# Patient Record
Sex: Female | Born: 1980 | Race: White | Hispanic: No | Smoking: Former smoker
Health system: Southern US, Community
[De-identification: ages and names within clinical notes are randomized; demographics above are authoritative.]

## PROBLEM LIST (undated history)

## (undated) DIAGNOSIS — F329 Major depressive disorder, single episode, unspecified: Secondary | ICD-10-CM

## (undated) DIAGNOSIS — R519 Headache, unspecified: Secondary | ICD-10-CM

## (undated) DIAGNOSIS — I493 Ventricular premature depolarization: Secondary | ICD-10-CM

## (undated) DIAGNOSIS — F32A Depression, unspecified: Secondary | ICD-10-CM

## (undated) DIAGNOSIS — Z862 Personal history of diseases of the blood and blood-forming organs and certain disorders involving the immune mechanism: Secondary | ICD-10-CM

## (undated) DIAGNOSIS — I10 Essential (primary) hypertension: Secondary | ICD-10-CM

## (undated) HISTORY — DX: Essential (primary) hypertension: I10

## (undated) HISTORY — DX: Ventricular premature depolarization: I49.3

## (undated) HISTORY — PX: WISDOM TOOTH EXTRACTION: SHX21

## (undated) HISTORY — DX: Depression, unspecified: F32.A

---
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
## 2002-12-15 ENCOUNTER — Other Ambulatory Visit: Admission: RE | Admit: 2002-12-15 | Discharge: 2002-12-15 | Payer: Self-pay | Admitting: Obstetrics & Gynecology

## 2003-12-21 ENCOUNTER — Other Ambulatory Visit: Admission: RE | Admit: 2003-12-21 | Discharge: 2003-12-21 | Payer: Self-pay | Admitting: Obstetrics & Gynecology

## 2006-10-12 DIAGNOSIS — D6862 Lupus anticoagulant syndrome: Secondary | ICD-10-CM

## 2006-10-12 HISTORY — DX: Other diseases of the blood and blood-forming organs and certain disorders involving the immune mechanism complicating pregnancy, third trimester: D68.62

## 2010-10-12 HISTORY — PX: TUBAL LIGATION: SHX77

## 2015-10-13 DIAGNOSIS — F319 Bipolar disorder, unspecified: Secondary | ICD-10-CM

## 2015-10-13 HISTORY — DX: Bipolar disorder, unspecified: F31.9

## 2016-02-05 DIAGNOSIS — F319 Bipolar disorder, unspecified: Secondary | ICD-10-CM | POA: Insufficient documentation

## 2016-02-05 DIAGNOSIS — I10 Essential (primary) hypertension: Secondary | ICD-10-CM | POA: Insufficient documentation

## 2016-02-05 HISTORY — DX: Essential (primary) hypertension: I10

## 2016-02-05 HISTORY — DX: Bipolar disorder, unspecified: F31.9

## 2019-05-24 NOTE — Progress Notes (Signed)
Cardiology Office Note:    Date:  05/25/2019   ID:  Carmen Ramirez, DOB 02/08/1981, MRN [id number]  PCP:  Shellia CleverlyBeane, Lori M, PA  Cardiologist:  Norman HerrlichBrian Royce Stegman, MD   Referring MD: Shellia CleverlyBeane, Lori M, [place]  ASSESSMENT:    1. Essential hypertension   2. Nonspecific abnormal electrocardiogram (ECG) (EKG)    PLAN:    In order of problems listed above:  1. Stable continue current antihypertensive 2. T wave changes I suspect this is a manifestation of of hypertension LV strain we will recheck echocardiogram at this time after a nice discussion we do not feel she needs other evaluation such as cardiac MR or an ischemia evaluation  Next appointment as needed   Medication Adjustments/Labs and Tests Ordered: Current medicines are reviewed at length with the patient today.  Concerns regarding medicines are outlined above.  No orders of the defined types were placed in this encounter.  No orders of the defined types were placed in this encounter.    Chief Complaint  Patient presents with   Abnormal ECG    History of Present Illness:    Carmen Ramirez is a 38 y.o. female with hypertensionwho is being seen today for the evaluation of an abnormal EKG 05/20/2019 at the request of Shellia CleverlyBeane, Lori M, PA. [facility]. She has a PMH of HTN, PVC's, depression.She has a background history of palpitations in 2002 how she had an echocardiogram was normal and reviewed the report she was noted to have PVCs and 1 4 beat run of PVCs but a 24-hour Holter monitor was normal with a single ventricular premature contraction and 7 single APCs.  She had a history of hypertension in 3 years ago initiated therapy with number sustained in the range of 160/100 she has been well controlled and EKG was done routinely showing the T wave inversion on previous EKG in 2002 that I reviewed was normal.  She has had a little or no palpitation no short of breath or edema.  She had one episode of nonexertional brief  chest pain about a month ago no recurrence was not severe or sustained.  She has no known history of congenital or rheumatic heart disease.  There is no family history of sudden death or cardia myopathy.  She relates recent labs were performed and thyroid CBC and CMP were normal.  Past Medical History:  Diagnosis Date   Depression    HTN (hypertension)    PVC (premature ventricular contraction)     Past Surgical History:  Procedure Laterality Date   CESAREAN SECTION     TUBAL LIGATION      Current Medications: Current Meds  Medication Sig   buPROPion (WELLBUTRIN XL) 300 MG 24 hr tablet Take 300 mg by mouth daily.   escitalopram (LEXAPRO) 20 MG tablet Take 20 mg by mouth daily.   nebivolol (BYSTOLIC) 10 MG tablet Take 10 mg by mouth daily.   Oxcarbazepine (TRILEPTAL) 300 MG tablet Take 300 mg by mouth daily.     Allergies:   Penicillins and Sulfa antibiotics   Social History   Socioeconomic History   Marital status: Unknown    Spouse name: Not on file   Number of children: Not on file   Years of education: Not on file   Highest education level: Not on file  Occupational History   Not on file  Social Needs   Financial resource strain: Not on file   Food insecurity    Worry: Not  on file    Inability: Not on file   Transportation needs    Medical: Not on file    Non-medical: Not on file  Tobacco Use   Smoking status: Former Smoker    Packs/day: 0.50    Years: 10.00    Pack years: 5.00    Quit date: 2015    Years since quitting: 5.6   Smokeless tobacco: Never Used  Substance and Sexual Activity   Alcohol use: Yes    Comment: social   Drug use: Never   Sexual activity: Not on file  Lifestyle   Physical activity    Days per week: Not on file    Minutes per session: Not on file   Stress: Not on file  Relationships   Social connections    Talks on phone: Not on file    Gets together: Not on file    Attends religious service: Not on  file    Active member of club or organization: Not on file    Attends meetings of clubs or organizations: Not on file    Relationship status: Not on file  Other Topics Concern   Not on file  Social History Narrative   Works as PA in [employer]     Family History: The patient's family history includes Cancer in her paternal grandmother; Hypertension in her maternal grandfather, maternal grandmother, and paternal grandmother; Stroke in her paternal grandfather.  ROS:   ROS Please see the history of present illness.     All other systems reviewed and are negative.  EKGs/Labs/Other Studies Reviewed:    The following studies were reviewed today:   EKG:  EKG is  ordered today.  The ekg ordered today is personally reviewed and demonstrates similar pattern of T wave inversions inferiorly precordial lead most consistent with repolarization secondary to hypertension  Recent Labs: No results found for requested labs within last 8760 hours.  Recent Lipid Panel No results found for: CHOL, TRIG, HDL, CHOLHDL, VLDL, LDLCALC, LDLDIRECT  Physical Exam:    VS:  BP 104/68 (BP Location: Right Arm, Patient Position: Sitting, Cuff Size: Normal)    Pulse 78    Ht 5\' 7"  (1.702 m)    Wt 158 lb 1.9 oz (71.7 kg)    SpO2 98%    BMI 24.77 kg/m     Wt Readings from Last 3 Encounters:  05/25/19 158 lb 1.9 oz (71.7 kg)     GEN:  Well nourished, well developed in no acute distress HEENT: Normal NECK: No JVD; No carotid bruits LYMPHATICS: No lymphadenopathy CARDIAC: RRR, no murmurs, rubs, gallops RESPIRATORY:  Clear to auscultation without rales, wheezing or rhonchi  ABDOMEN: Soft, non-tender, non-distended MUSCULOSKELETAL:  No edema; No deformity  SKIN: Warm and dry NEUROLOGIC:  Alert and oriented x 3 PSYCHIATRIC:  Normal affect     Signed, Shirlee More, MD  05/25/2019 2:28 PM    [facility]

## 2019-05-25 ENCOUNTER — Ambulatory Visit (INDEPENDENT_AMBULATORY_CARE_PROVIDER_SITE_OTHER): Payer: BC Managed Care – PPO | Admitting: Cardiology

## 2019-05-25 ENCOUNTER — Other Ambulatory Visit: Payer: Self-pay

## 2019-05-25 ENCOUNTER — Encounter: Payer: Self-pay | Admitting: Cardiology

## 2019-05-25 VITALS — BP 104/68 | HR 78 | Ht 67.0 in | Wt 158.1 lb

## 2019-05-25 DIAGNOSIS — I10 Essential (primary) hypertension: Secondary | ICD-10-CM | POA: Diagnosis not present

## 2019-05-25 DIAGNOSIS — R9431 Abnormal electrocardiogram [ECG] [EKG]: Secondary | ICD-10-CM

## 2019-05-25 NOTE — Patient Instructions (Signed)
Medication Instructions:  Your physician recommends that you continue on your current medications as directed. Please refer to the Current Medication list given to you today.  If you need a refill on your cardiac medications before your next appointment, please call your pharmacy.   Lab work: None  If you have labs (blood work) drawn today and your tests are completely normal, you will receive your results only by: Marland Kitchen MyChart Message (if you have MyChart) OR . A paper copy in the mail If you have any lab test that is abnormal or we need to change your treatment, we will call you to review the results.  Testing/Procedures: You had an EKG today.   Your physician has requested that you have an echocardiogram. Echocardiography is a painless test that uses sound waves to create images of your heart. It provides your doctor with information about the size and shape of your heart and how well your heart's chambers and valves are working. This procedure takes approximately one hour. There are no restrictions for this procedure.  Follow-Up: At [facility], you and your health needs are our priority.  As part of our continuing mission to provide you with exceptional heart care, we have created designated Provider Care Teams.  These Care Teams include your primary Cardiologist (physician) and Advanced Practice Providers (APPs -  Physician Assistants and Nurse Practitioners) who all work together to provide you with the care you need, when you need it. You will need a follow up appointment as needed if symptoms worsen or fail to improve.     Echocardiogram An echocardiogram is a procedure that uses painless sound waves (ultrasound) to produce an image of the heart. Images from an echocardiogram can provide important information about:  Signs of coronary artery disease (CAD).  Aneurysm detection. An aneurysm is a weak or damaged part of an artery wall that bulges out from the normal force of blood  pumping through the body.  Heart size and shape. Changes in the size or shape of the heart can be associated with certain conditions, including heart failure, aneurysm, and CAD.  Heart muscle function.  Heart valve function.  Signs of a past heart attack.  Fluid buildup around the heart.  Thickening of the heart muscle.  A tumor or infectious growth around the heart valves. Tell a health care provider about:  Any allergies you have.  All medicines you are taking, including vitamins, herbs, eye drops, creams, and over-the-counter medicines.  Any blood disorders you have.  Any surgeries you have had.  Any medical conditions you have.  Whether you are pregnant or may be pregnant. What are the risks? Generally, this is a safe procedure. However, problems may occur, including:  Allergic reaction to dye (contrast) that may be used during the procedure. What happens before the procedure? No specific preparation is needed. You may eat and drink normally. What happens during the procedure?   An IV tube may be inserted into one of your veins.  You may receive contrast through this tube. A contrast is an injection that improves the quality of the pictures from your heart.  A gel will be applied to your chest.  A wand-like tool (transducer) will be moved over your chest. The gel will help to transmit the sound waves from the transducer.  The sound waves will harmlessly bounce off of your heart to allow the heart images to be captured in real-time motion. The images will be recorded on a computer. The procedure  may vary among health care providers and hospitals. What happens after the procedure?  You may return to your normal, everyday life, including diet, activities, and medicines, unless your health care provider tells you not to do that. Summary  An echocardiogram is a procedure that uses painless sound waves (ultrasound) to produce an image of the heart.  Images from an  echocardiogram can provide important information about the size and shape of your heart, heart muscle function, heart valve function, and fluid buildup around your heart.  You do not need to do anything to prepare before this procedure. You may eat and drink normally.  After the echocardiogram is completed, you may return to your normal, everyday life, unless your health care provider tells you not to do that. This information is not intended to replace advice given to you by your health care provider. Make sure you discuss any questions you have with your health care provider. Document Released: 09/25/2000 Document Revised: 01/19/2019 Document Reviewed: 10/31/2016 Elsevier Patient Education  2020 [employer]

## 2019-05-29 ENCOUNTER — Other Ambulatory Visit: Payer: Self-pay

## 2019-05-29 ENCOUNTER — Ambulatory Visit (HOSPITAL_BASED_OUTPATIENT_CLINIC_OR_DEPARTMENT_OTHER)
Admission: RE | Admit: 2019-05-29 | Discharge: 2019-05-29 | Disposition: A | Discharge status: Home / Self Care | Payer: BC Managed Care – PPO | Source: Ambulatory Visit | Attending: Cardiology | Admitting: Cardiology

## 2019-05-29 DIAGNOSIS — R9431 Abnormal electrocardiogram [ECG] [EKG]: Secondary | ICD-10-CM | POA: Diagnosis present

## 2019-05-29 DIAGNOSIS — I10 Essential (primary) hypertension: Secondary | ICD-10-CM | POA: Diagnosis present

## 2019-05-29 NOTE — Progress Notes (Signed)
  Echocardiogram 2D Echocardiogram has been performed.  Cardell Peach 05/29/2019, 11:36 AM

## 2020-12-14 ENCOUNTER — Other Ambulatory Visit: Payer: Self-pay

## 2020-12-14 ENCOUNTER — Emergency Department (HOSPITAL_BASED_OUTPATIENT_CLINIC_OR_DEPARTMENT_OTHER): Payer: Commercial Managed Care - PPO

## 2020-12-14 ENCOUNTER — Encounter (HOSPITAL_BASED_OUTPATIENT_CLINIC_OR_DEPARTMENT_OTHER): Payer: Self-pay | Admitting: *Deleted

## 2020-12-14 ENCOUNTER — Emergency Department (HOSPITAL_BASED_OUTPATIENT_CLINIC_OR_DEPARTMENT_OTHER)
Admission: EM | Admit: 2020-12-14 | Discharge: 2020-12-14 | Disposition: A | Discharge status: Home / Self Care | Payer: Commercial Managed Care - PPO | Attending: Emergency Medicine | Admitting: Emergency Medicine

## 2020-12-14 DIAGNOSIS — I10 Essential (primary) hypertension: Secondary | ICD-10-CM | POA: Diagnosis not present

## 2020-12-14 DIAGNOSIS — R0602 Shortness of breath: Secondary | ICD-10-CM | POA: Insufficient documentation

## 2020-12-14 DIAGNOSIS — R0789 Other chest pain: Secondary | ICD-10-CM | POA: Insufficient documentation

## 2020-12-14 DIAGNOSIS — Z87891 Personal history of nicotine dependence: Secondary | ICD-10-CM | POA: Insufficient documentation

## 2020-12-14 DIAGNOSIS — R079 Chest pain, unspecified: Secondary | ICD-10-CM

## 2020-12-14 DIAGNOSIS — Z79899 Other long term (current) drug therapy: Secondary | ICD-10-CM | POA: Insufficient documentation

## 2020-12-14 DIAGNOSIS — R000 Tachycardia, unspecified: Secondary | ICD-10-CM | POA: Insufficient documentation

## 2020-12-14 LAB — CBC
HCT: 40.2 % (ref 36.0–46.0)
Hemoglobin: 14.3 g/dL (ref 12.0–15.0)
MCH: 32.1 pg (ref 26.0–34.0)
MCHC: 35.6 g/dL (ref 30.0–36.0)
MCV: 90.1 fL (ref 80.0–100.0)
Platelets: 219 10*3/uL (ref 150–400)
RBC: 4.46 MIL/uL (ref 3.87–5.11)
RDW: 12.3 % (ref 11.5–15.5)
WBC: 8.4 10*3/uL (ref 4.0–10.5)
nRBC: 0 % (ref 0.0–0.2)

## 2020-12-14 LAB — BASIC METABOLIC PANEL
Anion gap: 12 (ref 5–15)
BUN: 16 mg/dL (ref 6–20)
CO2: 20 mmol/L — ABNORMAL LOW (ref 22–32)
Calcium: 9.6 mg/dL (ref 8.9–10.3)
Chloride: 104 mmol/L (ref 98–111)
Creatinine, Ser: 0.77 mg/dL (ref 0.44–1.00)
GFR, Estimated: >60 mL/min (ref >60)
Glucose, Bld: 104 mg/dL — ABNORMAL HIGH (ref 70–99)
Potassium: 3.6 mmol/L (ref 3.5–5.1)
Sodium: 136 mmol/L (ref 135–145)

## 2020-12-14 LAB — D-DIMER, QUANTITATIVE: D-Dimer, Quant: 0.82 ug/mL-FEU — ABNORMAL HIGH (ref 0.00–0.50)

## 2020-12-14 LAB — TROPONIN I (HIGH SENSITIVITY)
Troponin I (High Sensitivity): <2 ng/L (ref <18)
Troponin I (High Sensitivity): <2 ng/L (ref <18)

## 2020-12-14 MED ORDER — IOHEXOL 300 MG/ML  SOLN: 100.0000 mL | Freq: Once | INTRAMUSCULAR | Status: DC | PRN

## 2020-12-14 MED ORDER — IOHEXOL 350 MG/ML SOLN
100.0000 mL | Freq: Once | INTRAVENOUS | Status: AC | PRN
  Administered 2020-12-14: 100 mL via INTRAVENOUS

## 2020-12-14 NOTE — ED Triage Notes (Addendum)
Pt reports chest pressure radiating to left shoulder, SOB with exertion, nausea. States she is on a beta blocker. HR 100s in triage, pt sob while giving history

## 2020-12-14 NOTE — ED Provider Notes (Signed)
[facility] EMERGENCY DEPARTMENT Provider Note   CSN: [id number] Arrival date & time: 12/14/20  1856     History Chief Complaint  Patient presents with  . Chest Pain    Carmen Ramirez is a 40 y.o. female.  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 SHOB worse with exertion.  Reports recent flight to [place], no leg swelling. Family history for PE in aunt, otherwise none.         Past Medical History:  Diagnosis Date  . Depression   . HTN (hypertension)   . PVC (premature ventricular contraction)     Patient Active Problem List   Diagnosis Date Noted  . Bipolar depression (HCC) 02/05/2016  . Essential hypertension 02/05/2016    Past Surgical History:  Procedure Laterality Date  . CESAREAN SECTION    . TUBAL LIGATION       OB History   No obstetric history on file.     Family History  Problem Relation Age of Onset  . Hypertension Maternal Grandmother   . Hypertension Maternal Grandfather   . Cancer Paternal Grandmother   . Hypertension Paternal Grandmother   . Stroke Paternal Grandfather     Social History   Tobacco Use  . Smoking status: Former Smoker    Packs/day: 0.50    Years: 10.00    Pack years: 5.00    Quit date: 2015    Years since quitting: 7.1  . Smokeless tobacco: Never Used  Vaping Use  . Vaping Use: Former  . Quit date: 10/12/2016  Substance Use Topics  . Alcohol use: Yes    Comment: social  . Drug use: Never    Home Medications Prior to Admission medications   Medication Sig Start Date End Date Taking? Authorizing Provider  buPROPion (WELLBUTRIN XL) 300 MG 24 hr tablet Take 300 mg by mouth daily.  01/27/17   [provider]  escitalopram (LEXAPRO) 20 MG tablet Take 20 mg by mouth daily. 01/27/17   [provider]  nebivolol (BYSTOLIC) 10 MG tablet Take 10 mg by mouth daily. 01/27/17 01/25/20  [provider]  Oxcarbazepine (TRILEPTAL) 300 MG tablet Take 300 mg by mouth daily.    [provider]    Allergies    Penicillins and Sulfa antibiotics  Review of Systems   Review of Systems  Constitutional: Negative for fever.  Respiratory: Positive for shortness of breath.   Cardiovascular: Positive for chest pain. Negative for palpitations and leg swelling.  Gastrointestinal: Negative for abdominal pain and vomiting.  Genitourinary: Negative for dysuria.  Musculoskeletal: Positive for arthralgias.  Skin: Negative for rash and wound.  Allergic/Immunologic: Negative for immunocompromised state.  Neurological: Negative for dizziness and weakness.  All other systems reviewed and are negative.   Physical Exam Updated Vital Signs BP 126/84 (BP Location: Right Arm)   Pulse 89   Temp 98.4 F (36.9 C) (Oral)   Resp 20   Ht 5\' 7"  (1.702 m)   Wt 65.8 kg   LMP 11/23/2020   SpO2 100%   BMI 22.71 kg/m   Physical Exam Vitals and nursing note reviewed.  Constitutional:      General: She is not in acute distress.    Appearance: She is well-developed and  well-nourished. She is not diaphoretic.  HENT:     Head: Normocephalic and atraumatic.  Cardiovascular:     Rate and Rhythm: Normal rate and regular rhythm.     Heart sounds: Normal heart sounds. No murmur heard.   Pulmonary:     Effort: Pulmonary effort is normal. Tachypnea present. No respiratory distress.     Breath sounds: Normal breath sounds.  Chest:     Chest wall: No tenderness.  Abdominal:     Palpations: Abdomen is soft.     Tenderness: There is no abdominal tenderness.  Musculoskeletal:     Right lower leg: No tenderness. No edema.     Left lower leg: No tenderness. No edema.   Skin:    General: Skin is warm and dry.     Findings: No erythema or rash.  Neurological:     Mental Status: She is alert and oriented to person, place, and time.  Psychiatric:        Mood and Affect: Mood and affect normal.        Behavior: Behavior normal.     ED Results / Procedures / Treatments   Labs (all labs ordered are listed, but only abnormal results are displayed) Labs Reviewed  BASIC METABOLIC PANEL - Abnormal; Notable for the following components:      Result Value   CO2 20 (*)    Glucose, Bld 104 (*)    All other components within normal limits  D-DIMER, QUANTITATIVE - Abnormal; Notable for the following components:   D-Dimer, Quant 0.82 (*)    All other components within normal limits  CBC  PREGNANCY, URINE  TROPONIN I (HIGH SENSITIVITY)  TROPONIN I (HIGH SENSITIVITY)    EKG EKG Interpretation  Date/Time:  Saturday December 14 2020 19:06:48 EST Ventricular Rate:  86 PR Interval:    QRS Duration: 101 QT Interval:  390 QTC Calculation: 467 R Axis:   83 Text Interpretation: Normal sinus rhythm no stemi Confirmed by Marianna Fuss ([id number]) on 12/14/2020 7:10:29 PM   Radiology DG Chest 2 View  Result Date: 12/14/2020 CLINICAL DATA:  Chest pain and shortness of breath. EXAM: CHEST - 2 VIEW COMPARISON:  None. FINDINGS: The cardiomediastinal contours are normal. The lungs are clear. Pulmonary vasculature is normal. No consolidation, pleural effusion, or pneumothorax. No acute osseous abnormalities are seen. IMPRESSION: Negative radiographs of the chest. Electronically Signed   By: Narda Rutherford M.D.   On: 12/14/2020 19:56   CT Angio Chest PE W and/or Wo Contrast  Result Date: 12/14/2020 CLINICAL DATA:  Shortness of breath, elevated D-dimer EXAM: CT ANGIOGRAPHY CHEST WITH CONTRAST TECHNIQUE: Multidetector CT imaging of the chest was performed using the standard protocol during bolus administration of intravenous contrast. Multiplanar CT image reconstructions and  MIPs were obtained to evaluate the vascular anatomy. CONTRAST:  OMNIPAQUE IOHEXOL 350 MG/ML SOLN COMPARISON:  Radiograph 12/14/2020 FINDINGS: Cardiovascular: Satisfactory opacification the pulmonary arteries to the segmental level. No pulmonary artery filling defects are identified. Central pulmonary arteries are normal caliber. Cardiac size is top normal. No pericardial effusion. The aortic root is suboptimally assessed given cardiac pulsation artifact. The aorta is normal caliber. No acute luminal abnormality of the imaged aorta. No periaortic stranding or hemorrhage. Normal 3 vessel branching of the aortic arch. Proximal great vessels are unremarkable. Mediastinum/Nodes: No mediastinal fluid or gas. Normal thyroid gland and thoracic inlet. No acute abnormality of the trachea or esophagus. No worrisome mediastinal, hilar or axillary adenopathy. Lungs/Pleura: Dependent atelectatic changes. No consolidation, features  of edema, pneumothorax, or effusion. No suspicious pulmonary nodules or masses. Upper Abdomen: No acute abnormalities present in the visualized portions of the upper abdomen. Musculoskeletal: Mild multilevel discogenic changes in the spine. No acute or worrisome osseous or soft tissue abnormality of chest. Review of the MIP images confirms the above findings. IMPRESSION: No evidence of pulmonary embolism or other acute intrathoracic process. Electronically Signed   By: Kreg Shropshire M.D.   On: 12/14/2020 20:45    Procedures Procedures   Medications Ordered in ED Medications  iohexol (OMNIPAQUE) 300 MG/ML solution 100 mL (has no administration in time range)  iohexol (OMNIPAQUE) 350 MG/ML injection 100 mL (100 mLs Intravenous Contrast Given 12/14/20 2021)    ED Course  I have reviewed the triage vital signs and the nursing notes.  Pertinent labs & imaging results that were available during my care of the patient were reviewed by me and considered in my medical decision making (see chart  for details).  Clinical Course as of 12/14/20 2323  Sat Dec 14, 2020  [id number] 40 year old female presents as above, on exam, appears anxious, increased respiratory rate, HR mildly elevated at triage at 101, otherwise 80-90s. BP elevated to 148/88, improved to 126/84 at time of discharge. O2 sats 100%. EKG without ischemic changes. CBC WNL. BMP without significant changes.  Troponin <2 x 2. Dimer elevated to 0.82, followed with CTA to evaluate for PE. Negative for PE, no other acute findings. Recommend recheck with PCP Monday as scheduled. Return to ER for worsening or concerning symptoms.  [LM]    Clinical Course User Index [LM] Alden Hipp   MDM Rules/Calculators/A&P                          Final Clinical Impression(s) / ED Diagnoses Final diagnoses:  Nonspecific chest pain  Tachycardia, unspecified  Shortness of breath    Rx / DC Orders ED Discharge Orders    None       Jeannie Fend, PA-C 12/14/20 2323    Milagros Loll, MD 12/17/20 [phone]

## 2020-12-14 NOTE — Discharge Instructions (Signed)
Follow-up with your doctor on Monday as discussed. Home to rest.  Return to the ER for worsening or concerning symptoms.

## 2020-12-25 ENCOUNTER — Encounter: Payer: Self-pay | Admitting: General Practice

## 2021-09-25 DIAGNOSIS — R87610 Atypical squamous cells of undetermined significance on cytologic smear of cervix (ASC-US): Secondary | ICD-10-CM | POA: Insufficient documentation

## 2021-09-25 DIAGNOSIS — Z1589 Genetic susceptibility to other disease: Secondary | ICD-10-CM

## 2021-09-25 HISTORY — DX: Genetic susceptibility to other disease: Z15.89

## 2021-09-25 HISTORY — DX: Atypical squamous cells of undetermined significance on cytologic smear of cervix (ASC-US): R87.610

## 2021-10-31 DIAGNOSIS — U071 COVID-19: Secondary | ICD-10-CM

## 2021-10-31 HISTORY — DX: COVID-19: U07.1

## 2021-11-28 ENCOUNTER — Encounter (HOSPITAL_COMMUNITY): Payer: Self-pay | Admitting: Orthopaedic Surgery

## 2021-11-28 ENCOUNTER — Other Ambulatory Visit: Payer: Self-pay

## 2021-11-28 DIAGNOSIS — S93602A Unspecified sprain of left foot, initial encounter: Secondary | ICD-10-CM

## 2021-11-28 DIAGNOSIS — M79672 Pain in left foot: Secondary | ICD-10-CM | POA: Insufficient documentation

## 2021-11-28 HISTORY — DX: Unspecified sprain of left foot, initial encounter: S93.602A

## 2021-11-28 HISTORY — DX: Pain in left foot: M79.672

## 2021-11-28 NOTE — Discharge Instructions (Addendum)
Netta Cedars, MD [facility]  Please read the following information regarding your care after surgery.  Medications  You only need a prescription for the narcotic pain medicine (ex. oxycodone, Percocet, Norco).  All of the other medicines listed below are available over the counter. ? Aleve 2 pills twice a day for the first 3 days after surgery. ? acetominophen (Tylenol) 650 mg every 4-6 hours as you need for minor to moderate pain ? oxycodone as prescribed for severe pain  ? To help prevent blood clots, take xarelto 10 mg daily for 6 weeks after surgery.  You should also get up every hour while you are awake to move around.  Weight Bearing ? Do NOT bear any weight on the operated leg or foot.  Cast / Splint / Dressing ? If you have a splint, do NOT remove this. Keep your splint, cast or dressing clean and dry.  Dont put anything (coat hanger, pencil, etc) down inside of it.  If it gets wet, call the office immediately to schedule an appointment for a cast change.  Swelling IMPORTANT: It is normal for you to have swelling where you had surgery. To reduce swelling and pain, keep at least 3 pillows under your leg so that your toes are above your nose and your heel is above the level of your hip.  It may be necessary to keep your foot or leg elevated for several weeks.  This is critical to helping your incisions heal and your pain to feel better.  Follow Up Call my office at [phone] when you are discharged from the hospital or surgery center to schedule an appointment to be seen 7-10 days after surgery.  Call my office at [phone] if you develop a fever >101.5 F, nausea, vomiting, bleeding from the surgical site or severe pain.

## 2021-11-28 NOTE — Progress Notes (Signed)
DUE TO COVID-19 ONLY ONE VISITOR IS ALLOWED TO COME WITH YOU AND STAY IN THE WAITING ROOM ONLY DURING PRE OP AND PROCEDURE DAY OF SURGERY.   PCP - Daphane Shepherd, PA-C Cardiologist - n/a  Chest x-ray - 12/14/20 (2V) EKG - 12/14/20 Stress Test - n/a ECHO - 05/29/19 Cardiac Cath - n/a  ICD Pacemaker/Loop - n/a  Sleep Study -  n/a CPAP - none  STOP now taking any Aspirin (unless otherwise instructed by your surgeon), Aleve, Naproxen, Ibuprofen, Motrin, Advil, Goody's, BC's, all herbal medications, fish oil, and all vitamins.   Coronavirus Screening Covid test n/a Ambulatory Surgery  Do you have any of the following symptoms:  Cough yes/no: No Fever (>100.94F)  yes/no: No Runny nose yes/no: No Sore throat yes/no: No Difficulty breathing/shortness of breath  yes/no: No  Have you traveled in the last 14 days and where? yes/no: No  Patient verbalized understanding of instructions that were given via phone.

## 2021-11-29 ENCOUNTER — Ambulatory Visit (HOSPITAL_BASED_OUTPATIENT_CLINIC_OR_DEPARTMENT_OTHER): Payer: Commercial Managed Care - PPO | Admitting: Anesthesiology

## 2021-11-29 ENCOUNTER — Other Ambulatory Visit: Payer: Self-pay

## 2021-11-29 ENCOUNTER — Ambulatory Visit (HOSPITAL_COMMUNITY): Payer: Commercial Managed Care - PPO | Admitting: Anesthesiology

## 2021-11-29 ENCOUNTER — Encounter (HOSPITAL_COMMUNITY)
Admission: RE | Disposition: A | Discharge status: Home / Self Care | Payer: Self-pay | Source: Home / Self Care | Attending: Orthopaedic Surgery

## 2021-11-29 ENCOUNTER — Ambulatory Visit (HOSPITAL_COMMUNITY)
Admission: RE | Admit: 2021-11-29 | Discharge: 2021-11-29 | Disposition: A | Discharge status: Home / Self Care | Payer: Commercial Managed Care - PPO | Attending: Orthopaedic Surgery | Admitting: Orthopaedic Surgery

## 2021-11-29 ENCOUNTER — Ambulatory Visit (HOSPITAL_COMMUNITY): Payer: Commercial Managed Care - PPO

## 2021-11-29 ENCOUNTER — Encounter (HOSPITAL_COMMUNITY): Payer: Self-pay | Admitting: Orthopaedic Surgery

## 2021-11-29 DIAGNOSIS — I10 Essential (primary) hypertension: Secondary | ICD-10-CM | POA: Insufficient documentation

## 2021-11-29 DIAGNOSIS — Z419 Encounter for procedure for purposes other than remedying health state, unspecified: Secondary | ICD-10-CM

## 2021-11-29 DIAGNOSIS — Z87891 Personal history of nicotine dependence: Secondary | ICD-10-CM | POA: Diagnosis not present

## 2021-11-29 DIAGNOSIS — S92812A Other fracture of left foot, initial encounter for closed fracture: Secondary | ICD-10-CM | POA: Diagnosis not present

## 2021-11-29 DIAGNOSIS — F319 Bipolar disorder, unspecified: Secondary | ICD-10-CM | POA: Insufficient documentation

## 2021-11-29 DIAGNOSIS — W109XXA Fall (on) (from) unspecified stairs and steps, initial encounter: Secondary | ICD-10-CM | POA: Diagnosis not present

## 2021-11-29 DIAGNOSIS — S92202A Fracture of unspecified tarsal bone(s) of left foot, initial encounter for closed fracture: Secondary | ICD-10-CM | POA: Diagnosis not present

## 2021-11-29 HISTORY — PX: OPEN REDUCTION INTERNAL FIXATION (ORIF) FOOT LISFRANC FRACTURE: SHX5990

## 2021-11-29 HISTORY — DX: Headache, unspecified: R51.9

## 2021-11-29 HISTORY — DX: Personal history of diseases of the blood and blood-forming organs and certain disorders involving the immune mechanism: Z86.2

## 2021-11-29 LAB — POCT PREGNANCY, URINE: Preg Test, Ur: NEGATIVE

## 2021-11-29 SURGERY — OPEN REDUCTION INTERNAL FIXATION (ORIF) FOOT LISFRANC FRACTURE
Anesthesia: Regional | Site: Foot | Laterality: Left

## 2021-11-29 MED ORDER — ONDANSETRON HCL 4 MG/2ML IJ SOLN
INTRAMUSCULAR | Status: AC
  Filled 2021-11-29: qty 2

## 2021-11-29 MED ORDER — VANCOMYCIN HCL 500 MG IV SOLR
INTRAVENOUS | Status: AC
  Filled 2021-11-29: qty 10

## 2021-11-29 MED ORDER — PHENYLEPHRINE HCL-NACL 20-0.9 MG/250ML-% IV SOLN
INTRAVENOUS | Status: DC | PRN
Start: 2021-11-29 — End: 2021-11-29
  Administered 2021-11-29: 25 ug/min via INTRAVENOUS

## 2021-11-29 MED ORDER — CHLORHEXIDINE GLUCONATE 0.12 % MT SOLN
15.0000 mL | Freq: Once | OROMUCOSAL | Status: AC
  Administered 2021-11-29: 15 mL via OROMUCOSAL
  Filled 2021-11-29: qty 15

## 2021-11-29 MED ORDER — DEXAMETHASONE SODIUM PHOSPHATE 10 MG/ML IJ SOLN
INTRAMUSCULAR | Status: AC
  Filled 2021-11-29: qty 1

## 2021-11-29 MED ORDER — LIDOCAINE HCL (CARDIAC) PF 100 MG/5ML IV SOSY
PREFILLED_SYRINGE | INTRAVENOUS | Status: DC | PRN
Start: 2021-11-29 — End: 2021-11-29

## 2021-11-29 MED ORDER — BUPIVACAINE-EPINEPHRINE (PF) 0.5% -1:200000 IJ SOLN
INTRAMUSCULAR | Status: DC | PRN
  Administered 2021-11-29: 30 mL via PERINEURAL

## 2021-11-29 MED ORDER — FENTANYL CITRATE (PF) 100 MCG/2ML IJ SOLN: 25.0000 ug | INTRAMUSCULAR | Status: DC | PRN

## 2021-11-29 MED ORDER — VANCOMYCIN HCL 500 MG IV SOLR: INTRAVENOUS | Status: DC | PRN

## 2021-11-29 MED ORDER — LIDOCAINE 2% (20 MG/ML) 5 ML SYRINGE
INTRAMUSCULAR | Status: AC
  Filled 2021-11-29: qty 5

## 2021-11-29 MED ORDER — 0.9 % SODIUM CHLORIDE (POUR BTL) OPTIME
TOPICAL | Status: DC | PRN
  Administered 2021-11-29: 1000 mL

## 2021-11-29 MED ORDER — OXYCODONE HCL 5 MG PO TABS: 5.0000 mg | ORAL_TABLET | Freq: Once | ORAL | Status: DC | PRN

## 2021-11-29 MED ORDER — FENTANYL CITRATE (PF) 250 MCG/5ML IJ SOLN
INTRAMUSCULAR | Status: AC
  Filled 2021-11-29: qty 5

## 2021-11-29 MED ORDER — PROPOFOL 10 MG/ML IV BOLUS
INTRAVENOUS | Status: AC
  Filled 2021-11-29: qty 20

## 2021-11-29 MED ORDER — CIPROFLOXACIN IN D5W 400 MG/200ML IV SOLN
400.0000 mg | Freq: Once | INTRAVENOUS | Status: AC
  Administered 2021-11-29: 400 mg via INTRAVENOUS
  Filled 2021-11-29: qty 200

## 2021-11-29 MED ORDER — PROPOFOL 10 MG/ML IV BOLUS
INTRAVENOUS | Status: DC | PRN
  Administered 2021-11-29: 200 mg via INTRAVENOUS

## 2021-11-29 MED ORDER — LIDOCAINE 2% (20 MG/ML) 5 ML SYRINGE
INTRAMUSCULAR | Status: DC | PRN
  Administered 2021-11-29: 40 mg via INTRAVENOUS

## 2021-11-29 MED ORDER — ONDANSETRON HCL 4 MG/2ML IJ SOLN
INTRAMUSCULAR | Status: DC | PRN
Start: 2021-11-29 — End: 2021-11-29
  Administered 2021-11-29: 4 mg via INTRAVENOUS

## 2021-11-29 MED ORDER — DEXAMETHASONE SODIUM PHOSPHATE 10 MG/ML IJ SOLN
INTRAMUSCULAR | Status: DC | PRN
  Administered 2021-11-29: 10 mg via INTRAVENOUS

## 2021-11-29 MED ORDER — CEFAZOLIN SODIUM-DEXTROSE 2-4 GM/100ML-% IV SOLN
INTRAVENOUS | Status: AC
  Filled 2021-11-29: qty 100

## 2021-11-29 MED ORDER — PROMETHAZINE HCL 25 MG/ML IJ SOLN: 6.2500 mg | INTRAMUSCULAR | Status: DC | PRN

## 2021-11-29 MED ORDER — ACETAMINOPHEN 10 MG/ML IV SOLN: 1000.0000 mg | Freq: Once | INTRAVENOUS | Status: DC | PRN

## 2021-11-29 MED ORDER — MIDAZOLAM HCL 2 MG/2ML IJ SOLN
INTRAMUSCULAR | Status: AC
  Filled 2021-11-29: qty 2

## 2021-11-29 MED ORDER — VANCOMYCIN HCL IN DEXTROSE 1-5 GM/200ML-% IV SOLN
1000.0000 mg | INTRAVENOUS | Status: AC
  Administered 2021-11-29: 1000 mg via INTRAVENOUS
  Filled 2021-11-29: qty 200

## 2021-11-29 MED ORDER — FENTANYL CITRATE (PF) 100 MCG/2ML IJ SOLN
INTRAMUSCULAR | Status: DC | PRN
  Administered 2021-11-29 (×2): 50 ug via INTRAVENOUS

## 2021-11-29 MED ORDER — ORAL CARE MOUTH RINSE: 15.0000 mL | Freq: Once | OROMUCOSAL | Status: AC

## 2021-11-29 MED ORDER — AMISULPRIDE (ANTIEMETIC) 5 MG/2ML IV SOLN: 10.0000 mg | Freq: Once | INTRAVENOUS | Status: DC | PRN

## 2021-11-29 MED ORDER — OXYCODONE HCL 5 MG/5ML PO SOLN: 5.0000 mg | Freq: Once | ORAL | Status: DC | PRN

## 2021-11-29 MED ORDER — LACTATED RINGERS IV SOLN: INTRAVENOUS | Status: DC

## 2021-11-29 MED ORDER — MIDAZOLAM HCL 5 MG/5ML IJ SOLN
INTRAMUSCULAR | Status: DC | PRN
  Administered 2021-11-29: 2 mg via INTRAVENOUS

## 2021-11-29 SURGICAL SUPPLY — 70 items
BAG COUNTER SPONGE SURGICOUNT (BAG) ×2 IMPLANT
BANDAGE ESMARK 6X9 LF (GAUZE/BANDAGES/DRESSINGS) ×1 IMPLANT
BIT DRILL 2.9 CANN QC NONSTRL (BIT) ×1 IMPLANT
BNDG ELASTIC 4X5.8 VLCR STR LF (GAUZE/BANDAGES/DRESSINGS) ×1 IMPLANT
BNDG ELASTIC 6X5.8 VLCR STR LF (GAUZE/BANDAGES/DRESSINGS) ×1 IMPLANT
BNDG ESMARK 6X9 LF (GAUZE/BANDAGES/DRESSINGS) ×2
BNDG GAUZE ELAST 4 BULKY (GAUZE/BANDAGES/DRESSINGS) ×3 IMPLANT
BRUSH SCRUB EZ PLAIN DRY (MISCELLANEOUS) ×3 IMPLANT
COVER MAYO STAND STRL (DRAPES) ×1 IMPLANT
COVER SURGICAL LIGHT HANDLE (MISCELLANEOUS) ×2 IMPLANT
DRAPE C-ARM 42X72 X-RAY (DRAPES) ×2 IMPLANT
DRAPE C-ARMOR (DRAPES) ×2 IMPLANT
DRAPE HALF SHEET 40X57 (DRAPES) ×1 IMPLANT
DRAPE U-SHAPE 47X51 STRL (DRAPES) ×1 IMPLANT
DRSG EMULSION OIL 3X3 NADH (GAUZE/BANDAGES/DRESSINGS) IMPLANT
DRSG PAD ABDOMINAL 8X10 ST (GAUZE/BANDAGES/DRESSINGS) ×1 IMPLANT
DRSG XEROFORM 1X8 (GAUZE/BANDAGES/DRESSINGS) ×1 IMPLANT
ELECT REM PT RETURN 9FT ADLT (ELECTROSURGICAL) ×2
ELECTRODE REM PT RTRN 9FT ADLT (ELECTROSURGICAL) ×1 IMPLANT
GAUZE SPONGE 4X4 12PLY STRL (GAUZE/BANDAGES/DRESSINGS) ×1 IMPLANT
GLOVE SRG 8 PF TXTR STRL LF DI (GLOVE) ×1 IMPLANT
GLOVE SURG ENC MOIS LTX SZ8 (GLOVE) ×1 IMPLANT
GLOVE SURG ORTHO LTX SZ7.5 (GLOVE) ×2 IMPLANT
GLOVE SURG UNDER POLY LF SZ7.5 (GLOVE) ×3 IMPLANT
GLOVE SURG UNDER POLY LF SZ8 (GLOVE)
GOWN STRL REUS W/ TWL LRG LVL3 (GOWN DISPOSABLE) ×2 IMPLANT
GOWN STRL REUS W/ TWL XL LVL3 (GOWN DISPOSABLE) ×1 IMPLANT
GOWN STRL REUS W/TWL LRG LVL3 (GOWN DISPOSABLE) ×2
GOWN STRL REUS W/TWL XL LVL3 (GOWN DISPOSABLE) ×1
K-WIRE ACE 1.6X6 (WIRE) ×4
KIT BASIN OR (CUSTOM PROCEDURE TRAY) ×2 IMPLANT
KIT TURNOVER KIT B (KITS) ×2 IMPLANT
KWIRE ACE 1.6X6 (WIRE) IMPLANT
MANIFOLD NEPTUNE II (INSTRUMENTS) ×2 IMPLANT
NDL HYPO 21X1.5 SAFETY (NEEDLE) IMPLANT
NEEDLE HYPO 21X1.5 SAFETY (NEEDLE) IMPLANT
NS IRRIG 1000ML POUR BTL (IV SOLUTION) ×2 IMPLANT
PACK ORTHO EXTREMITY (CUSTOM PROCEDURE TRAY) ×2 IMPLANT
PAD ARMBOARD 7.5X6 YLW CONV (MISCELLANEOUS) IMPLANT
PAD CAST 4YDX4 CTTN HI CHSV (CAST SUPPLIES) IMPLANT
PADDING CAST COTTON 4X4 STRL (CAST SUPPLIES)
PADDING CAST COTTON 6X4 STRL (CAST SUPPLIES) IMPLANT
PADDING CAST SYNTHETIC 4 (CAST SUPPLIES) ×1
PADDING CAST SYNTHETIC 4X4 STR (CAST SUPPLIES) IMPLANT
SCREW NLOCK CANC HEX 4X24 (Screw) ×1 IMPLANT
SCREW NLOCK CANC HEX 4X28 (Screw) ×1 IMPLANT
SCREW NLOCK CANC HEX 4X30 (Screw) ×1 IMPLANT
SCREW NLOCK FT 28X4XSLD NS FIB (Screw) IMPLANT
SCREW NLOCK T15 FT 24X4XST TPR (Screw) IMPLANT
SCREW NLOCK T15 FT 30X4XST TIP (Screw) IMPLANT
SPLINT PLASTER CAST XFAST 5X30 (CAST SUPPLIES) IMPLANT
SPLINT PLASTER XFAST SET 5X30 (CAST SUPPLIES) ×1
SPONGE T-LAP 18X18 ~~LOC~~+RFID (SPONGE) ×2 IMPLANT
STAPLER VISISTAT 35W (STAPLE) IMPLANT
SUCTION FRAZIER HANDLE 10FR (MISCELLANEOUS) ×1
SUCTION TUBE FRAZIER 10FR DISP (MISCELLANEOUS) ×1 IMPLANT
SUT ETHILON 2 0 FS 18 (SUTURE) ×3 IMPLANT
SUT ETHILON 3 0 PS 1 (SUTURE) ×1 IMPLANT
SUT MON AB 3-0 SH 27 (SUTURE) ×1
SUT MON AB 3-0 SH27 (SUTURE) IMPLANT
SUT PDS AB 2-0 CT1 27 (SUTURE) IMPLANT
SUT VIC AB 2-0 CT1 27 (SUTURE)
SUT VIC AB 2-0 CT1 TAPERPNT 27 (SUTURE) ×2 IMPLANT
SUT VIC AB 3-0 SH 27 (SUTURE) ×1
SUT VIC AB 3-0 SH 27XBRD (SUTURE) IMPLANT
TOWEL GREEN STERILE (TOWEL DISPOSABLE) ×4 IMPLANT
TOWEL GREEN STERILE FF (TOWEL DISPOSABLE) ×2 IMPLANT
TUBE CONNECTING 12X1/4 (SUCTIONS) ×2 IMPLANT
UNDERPAD 30X36 HEAVY ABSORB (UNDERPADS AND DIAPERS) ×2 IMPLANT
WATER STERILE IRR 1000ML POUR (IV SOLUTION) ×1 IMPLANT

## 2021-11-29 NOTE — Anesthesia Procedure Notes (Signed)
Procedure Name: LMA Insertion Date/Time: 11/29/2021 10:49 AM Performed by: Quentin Ore, CRNA Pre-anesthesia Checklist: Patient identified, Emergency Drugs available, Suction available and Patient being monitored Patient Re-evaluated:Patient Re-evaluated prior to induction Oxygen Delivery Method: Circle system utilized Preoxygenation: Pre-oxygenation with 100% oxygen Induction Type: IV induction LMA: LMA inserted LMA Size: 4.0 Number of attempts: 1 Placement Confirmation: positive ETCO2 and breath sounds checked- equal and bilateral Tube secured with: Tape Dental Injury: Teeth and Oropharynx as per pre-operative assessment

## 2021-11-29 NOTE — H&P (Signed)
H&P Update: ? ?-History and Physical Reviewed ? ?-Patient has been re-examined ? ?-No change in the plan of care ? ?-The risks and benefits were presented and reviewed. The risks due to hardware failure/irritation, new/persistent infection, stiffness, nerve/vessel/tendon injury, nonunion/malunion, wound healing issues, development of arthritis, failure of this surgery, possibility of external fixation with delayed definitive surgery, need for further surgery, thromboembolic events, anesthesia/medical complications, amputation, death among others were discussed. The patient acknowledged the explanation, agreed to proceed with the plan and a consent was signed. ? ?Carmen Ramirez ? ?

## 2021-11-29 NOTE — Anesthesia Procedure Notes (Signed)
Anesthesia Regional Block: Popliteal block   Pre-Anesthetic Checklist: , timeout performed,  Correct Patient, Correct Site, Correct Laterality,  Correct Procedure, Correct Position, site marked,  Risks and benefits discussed,  Surgical consent,  Pre-op evaluation,  At surgeon's request and post-op pain management  Laterality: Left  Prep: chloraprep       Needles:  Injection technique: Single-shot  Needle Type: Echogenic Stimulator Needle     Needle Length: 9cm  Needle Gauge: 21     Additional Needles:   Procedures:,,,, ultrasound used (permanent image in chart),,    Narrative:  Start time: 11/29/2021 10:25 AM End time: 11/29/2021 10:35 AM Injection made incrementally with aspirations every 5 mL.  Performed by: Personally  Anesthesiologist: Leonides Grills, MD  Additional Notes: Functioning IV was confirmed and monitors were applied.  A timeout was performed. Sterile prep, hand hygiene and sterile gloves were used. A 67mm 21ga Arrow echogenic stimulator needle was used. Negative aspiration and negative test dose prior to incremental administration of local anesthetic. The patient tolerated the procedure well.  Ultrasound guidance: relevent anatomy identified, needle position confirmed, local anesthetic spread visualized around nerve(s), vascular puncture avoided.  Image printed for medical record.

## 2021-11-29 NOTE — Op Note (Signed)
11/29/2021  6:41 PM   PATIENT: Carmen Ramirez  41 y.o. female  MRN: [id number]   PRE-OPERATIVE DIAGNOSIS:   Lisfranc injury of left midfoot   POST-OPERATIVE DIAGNOSIS:   Lisfranc injury of left midfoot   PROCEDURE: Left foot Lisfranc open reduction internal fixation Left foot intercuneiform joint open reduction internal fixation   SURGEON:  Netta Cedars, MD   ASSISTANT: None   ANESTHESIA: General, regional   EBL: Minimal   TOURNIQUET: None used   COMPLICATIONS: None apparent   DISPOSITION: Extubated, awake and stable to recovery.   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 She sustained this injury on 11/24/2021 when she missed the last step and landed on her foot. The patient has pain localized to the midfoot and radiographs as well as MRI confirmed unstable Lisfranc bony-ligamentous injury of the left midfoot.  We discussed the diagnosis, alternative treatment options, risks and benefits of the above surgical intervention, as well as alternative non-operative treatments. All questions/concerns were addressed and the patient/family demonstrated appropriate understanding of the diagnosis, the procedure, the postoperative course, and overall prognosis. The patient wished to proceed with surgical intervention and signed an informed surgical consent as such, in each others presence prior to surgery.   PROCEDURE IN DETAIL: After preoperative consent was obtained and the correct operative site was identified, the patient was brought to the operating room supine on stretcher and transferred onto operating table. General anesthesia was induced. Preoperative antibiotics were administered. Surgical timeout was taken. The patient was then positioned supine with an ipsilateral hip bump. The operative lower extremity was prepped and draped in standard sterile fashion.  Abduction stress  testing of the left foot demonstrated instability at the Lisfranc complex. A dorsal incision was made to debride the area of the Lisfranc ligament and to place pointed reduction clamp. The midfoot was reduced and this was provisionally pinned with Kirschner wire in the orientation of the native Lisfranc ligament. The intercuneiform joint between the medial and intermedial cuneiforms was also pinned with Kirschner wire.   Medial incisions were made over these wires and dissection carried down to the level of bone. The wires were overdrilled sequentially with a cannulated drill and the wires were removed while maintaining midfoot reduction. Two solid 4.0 fully threaded headed screws were implanted and stability was noted to clinical and fluoroscopic testing.   The surgical sites were thoroughly irrigated. Hemostasis was achieved. The subcutaneous tissues were closed using 3-0 monocryl. The skin was closed without tension using 3-0 nylon suture.    The leg was cleaned with saline and sterile xeroform dressings with gauze were applied. A well padded bulky short leg splint was applied. The patient was awakened from anesthesia and transported to the recovery room in stable condition.    FOLLOW UP PLAN: -transfer to PACU, then home -strict NWB operative extremity, maximum elevation -maintain short leg splint until follow up -DVT Ppx: Xarelto 10 mg once daily while NWB -follow up as outpatient in 7-10 days for wound check -sutures out in 2-3 weeks with exchange of short leg splint to short leg cast in outpatient office   RADIOGRAPHS: AP, lateral, oblique and stress radiographs of the left foot were obtained intraoperatively. These showed interval reduction and fixation of the fractures. Manual stress radiographs were taken and the midfoot was noted to be stable following fixation. All hardware is appropriately positioned and of the appropriate lengths. No other acute  injuries are noted.   Netta Cedars Orthopaedic Surgery [facility]

## 2021-11-29 NOTE — Brief Op Note (Signed)
11/29/2021  12:51 PM  PATIENT:  Carmen Ramirez  41 y.o. female  PRE-OPERATIVE DIAGNOSIS:  Sprain of left foot  POST-OPERATIVE DIAGNOSIS:  Sprain of left foot  PROCEDURE:  Procedure(s): OPEN REDUCTION INTERNAL FIXATION (ORIF) FOOT LISFRANC (Left)  SURGEON:  Surgeon(s) and Role:    * Netta Cedars, MD - Primary  PHYSICIAN ASSISTANT: None  ASSISTANTS: none   ANESTHESIA:   regional and general  EBL:  3 ml   BLOOD ADMINISTERED:none  DRAINS: none   LOCAL MEDICATIONS USED:  NONE  SPECIMEN:  No Specimen  DISPOSITION OF SPECIMEN:  N/A  COUNTS:  YES  TOURNIQUET:  None used  DICTATION: .Note written in EPIC  PLAN OF CARE: Discharge to home after PACU  PATIENT DISPOSITION:  PACU - hemodynamically stable.   Delay start of Pharmacological VTE agent (>24hrs) due to surgical blood loss or risk of bleeding: yes

## 2021-11-29 NOTE — Anesthesia Postprocedure Evaluation (Signed)
Anesthesia Post Note  Patient: Carmen Ramirez  Procedure(s) Performed: OPEN REDUCTION INTERNAL FIXATION (ORIF) FOOT LISFRANC (Left: Foot)     Patient location during evaluation: PACU Anesthesia Type: Regional and General Level of consciousness: awake Pain management: pain level controlled Vital Signs Assessment: post-procedure vital signs reviewed and stable Respiratory status: spontaneous breathing, nonlabored ventilation, respiratory function stable and patient connected to nasal cannula oxygen Cardiovascular status: blood pressure returned to baseline and stable Postop Assessment: no apparent nausea or vomiting Anesthetic complications: no   No notable events documented.  Last Vitals:  Vitals:   11/29/21 1258 11/29/21 1306  BP: 112/85 101/71  Pulse: 83 81  Resp: 14 10  Temp:  36.7 C  SpO2: 100% 100%    Last Pain:  Vitals:   11/29/21 1306  TempSrc:   PainSc: 0-No pain                 Ela Moffat P Toniann Dickerson

## 2021-11-29 NOTE — Transfer of Care (Signed)
Immediate Anesthesia Transfer of Care Note  Patient: Amare Kontos  Procedure(s) Performed: OPEN REDUCTION INTERNAL FIXATION (ORIF) FOOT LISFRANC (Left: Foot)  Patient Location: PACU  Anesthesia Type:GA combined with regional for post-op pain  Level of Consciousness: awake, alert  and oriented  Airway & Oxygen Therapy: Patient Spontanous Breathing  Post-op Assessment: Report given to RN, Post -op Vital signs reviewed and stable and Patient moving all extremities  Post vital signs: Reviewed and stable  Last Vitals:  Vitals Value Taken Time  BP 116/73 11/29/21 1243  Temp    Pulse 80 11/29/21 1247  Resp 13 11/29/21 1247  SpO2 100 % 11/29/21 1247  Vitals shown include unvalidated device data.  Last Pain:  Vitals:   11/29/21 0946  TempSrc: Oral  PainSc:       Patients Stated Pain Goal: 3 (11/29/21 0939)  Complications: No notable events documented.

## 2021-11-29 NOTE — Anesthesia Preprocedure Evaluation (Signed)
Anesthesia Evaluation  Patient identified by MRN, date of birth, ID band Patient awake    Reviewed: Allergy & Precautions, NPO status , Patient's Chart, lab work & pertinent test results  Airway Mallampati: I  TM Distance: >3 FB Neck ROM: Full    Dental no notable dental hx.    Pulmonary former smoker,    Pulmonary exam normal breath sounds clear to auscultation       Cardiovascular hypertension, Pt. on home beta blockers Normal cardiovascular exam Rhythm:Regular Rate:Normal     Neuro/Psych  Headaches, PSYCHIATRIC DISORDERS Depression Bipolar Disorder    GI/Hepatic negative GI ROS, Neg liver ROS,   Endo/Other  negative endocrine ROS  Renal/GU negative Renal ROS     Musculoskeletal negative musculoskeletal ROS (+)   Abdominal   Peds  Hematology negative hematology ROS (+)   Anesthesia Other Findings Sprain of left foot  Reproductive/Obstetrics hcg negative                             Anesthesia Physical Anesthesia Plan  ASA: 2  Anesthesia Plan: General and Regional   Post-op Pain Management:    Induction: Intravenous  PONV Risk Score and Plan: 3 and Ondansetron, Dexamethasone, Midazolam and Treatment may vary due to age or medical condition  Airway Management Planned: LMA  Additional Equipment:   Intra-op Plan:   Post-operative Plan: Extubation in OR  Informed Consent: I have reviewed the patients History and Physical, chart, labs and discussed the procedure including the risks, benefits and alternatives for the proposed anesthesia with the patient or authorized representative who has indicated his/her understanding and acceptance.     Dental advisory given  Plan Discussed with: CRNA  Anesthesia Plan Comments:         Anesthesia Quick Evaluation

## 2021-12-01 ENCOUNTER — Encounter (HOSPITAL_COMMUNITY): Payer: Self-pay | Admitting: Orthopaedic Surgery

## 2021-12-07 NOTE — H&P (Signed)
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 She sustained this injury on 11/24/2021 when she missed the last step and landed on her foot. The patient has pain localized to the midfoot and radiographs as well as MRI confirmed unstable Lisfranc bony-ligamentous injury of the left midfoot.   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, antepartum (HCC) 2008   PVC (premature ventricular contraction)    no current problems    Past Surgical History:  Procedure Laterality Date   CESAREAN SECTION     x3 - 2008, 2010, 2012   OPEN REDUCTION INTERNAL FIXATION (ORIF) FOOT LISFRANC FRACTURE Left 11/29/2021   Procedure: OPEN REDUCTION INTERNAL FIXATION (ORIF) FOOT LISFRANC;  Surgeon: Netta Cedars, MD;  Location: [facility] OR;  Service: Orthopedics;  Laterality: Left;   TUBAL LIGATION  2012   WISDOM TOOTH EXTRACTION       (Not in an outpatient encounter)    Allergies  Allergen Reactions   Penicillins Rash   Sulfa Antibiotics Rash    Social History   Socioeconomic History   Marital status: Divorced    Spouse name: Not on file   Number of children: Not on file   Years of education: Not on file   Highest education level: Not on file  Occupational History   Not on file  Tobacco Use   Smoking status: Former    Packs/day: 0.50    Years: 10.00    Pack years: 5.00    Types: Cigarettes    Quit date: 2015    Years since quitting: 8.1   Smokeless tobacco: Current  Vaping Use   Vaping Use: Every day   Last attempt to quit: 10/12/2016   Substances: Nicotine, Flavoring,  Nicotine-salt  Substance and Sexual Activity   Alcohol use: Yes    Alcohol/week: 4.0 - 6.0 standard drinks    Types: 4 - 6 Glasses of wine per week    Comment: social wine   Drug use: Never   Sexual activity: Yes    Birth control/protection: Surgical    Comment: Tubal Ligation  Other Topics Concern   Not on file  Social History Narrative   Works as NP in [employer]   Social Determinants of Corporate investment banker Strain: Not on file  Food Insecurity: Not on file  Transportation Needs: Not on file  Physical Activity: Not on file  Stress: Not on file  Social Connections: Not on file  Intimate Partner Violence: Not on file     History reviewed. No pertinent family history.   Review of Systems Pertinent items are noted in HPI.  Objective: Vital signs in last 24 hours: Vitals with BMI 11/29/2021 11/29/2021 11/29/2021  Height - - -  Weight - - -  BMI - - -  Systolic 101 112 737  Diastolic 71 85 73  Pulse 81 83 82      EXAM: General: Well nourished, well developed. Awake, alert and oriented to time, place, person. Normal mood and affect. No apparent distress. Breathing room air.  Operative Lower Extremity: Alignment -  Neutral Deformity - None Skin intact Tenderness to palpation - left midfoot Normal range of motion 5/5 TA, PT, GS, Per, EHL, FHL Sensation intact to light touch throughout Palpable DP and PT pulses Special testing: None  The contralateral foot/ankle was examined for comparison and noted to be neurovascularly intact with no localized deformity, swelling, or tenderness.  Imaging Review X-rays taken were independently reviewed by me demonstrating left Lisfranc injury.  Assessment/Plan: The clinical and radiographic findings were reviewed and discussed at length with the patient.  The patient has left Lisfranc midfoot injury.  We spoke at length about the natural course of these findings. We discussed nonoperative and operative treatment options  in detail.  I have recommended the following: Surgery in the form of left Lisfranc ORIF. The risks and benefits were presented and reviewed. The risks due to hardware failure/irritation, infection, stiffness, nerve/vessel/tendon injury, nonunion/malunion, wound healing issues, failure of this surgery, need for further surgery, thromboembolic events, amputation, death among others were discussed. The patient acknowledged the explanation, agreed to proceed with the plan and a consent was signed.  Netta Cedars  Orthopaedic Surgery [facility]

## 2021-12-10 ENCOUNTER — Other Ambulatory Visit: Payer: Self-pay | Admitting: Orthopaedic Surgery

## 2021-12-10 DIAGNOSIS — S93602A Unspecified sprain of left foot, initial encounter: Secondary | ICD-10-CM

## 2021-12-11 ENCOUNTER — Ambulatory Visit
Admission: RE | Admit: 2021-12-11 | Discharge: 2021-12-11 | Disposition: A | Discharge status: Home / Self Care | Payer: Commercial Managed Care - PPO | Source: Ambulatory Visit | Attending: Orthopaedic Surgery | Admitting: Orthopaedic Surgery

## 2021-12-11 DIAGNOSIS — S93602A Unspecified sprain of left foot, initial encounter: Secondary | ICD-10-CM

## 2022-08-13 ENCOUNTER — Telehealth: Payer: Self-pay

## 2022-08-13 NOTE — Telephone Encounter (Signed)
   Pre-operative Risk Assessment    Patient Name: Carmen Ramirez  DOB: 09-Mar-1981 MRN: [id number]      Request for Surgical Clearance    Procedure:   Right Foot Hardware Removal  Date of Surgery:  Clearance 09/16/22                                 Surgeon:  Dr. Armond Hang Surgeon's Group or Practice Name:  [facility] Phone number:  [phone] Fax number:  [phone]   Type of Clearance Requested:   - Medical and Pharmacy   Type of Anesthesia:  Not Indicated   Additional requests/questions:  Please fax a copy of form with written instructions on stopping Aspirin  to the surgeon's office.  Daylene Katayama   08/13/2022, 5:43 PM

## 2022-08-14 NOTE — Telephone Encounter (Signed)
Pt is scheduled to see Dr. Agustin Cree on 08/27/22 for clearance.

## 2022-08-14 NOTE — Telephone Encounter (Signed)
   Name: Cali Hope  DOB: February 01, 1981  MRN: [id number]  Primary Cardiologist: None  Chart reviewed as part of pre-operative protocol coverage. Because of Niylah Hassan past medical history and time since last visit, she will require a follow-up in-office visit in order to better assess preoperative cardiovascular risk.  Pre-op covering staff: - Please schedule appointment and call patient to inform them. If patient already had an upcoming appointment within acceptable timeframe, please add "pre-op clearance" to the appointment notes so provider is aware. - Please contact requesting surgeon's office via preferred method (i.e, phone, fax) to inform them of need for appointment prior to surgery.  Lenna Sciara, NP  08/14/2022, 8:48 AM

## 2022-08-14 NOTE — Telephone Encounter (Signed)
Will route to [place] scheduling to assist with scheduling.

## 2022-08-27 ENCOUNTER — Ambulatory Visit: Payer: Commercial Managed Care - PPO | Admitting: Cardiology

## 2022-09-10 ENCOUNTER — Ambulatory Visit: Payer: Commercial Managed Care - PPO | Attending: Cardiology | Admitting: Cardiology

## 2022-09-10 ENCOUNTER — Encounter: Payer: Self-pay | Admitting: Cardiology

## 2022-09-10 VITALS — BP 98/68 | HR 76 | Ht 67.0 in | Wt 160.6 lb

## 2022-09-10 DIAGNOSIS — S93602S Unspecified sprain of left foot, sequela: Secondary | ICD-10-CM

## 2022-09-10 DIAGNOSIS — R9431 Abnormal electrocardiogram [ECG] [EKG]: Secondary | ICD-10-CM | POA: Diagnosis not present

## 2022-09-10 DIAGNOSIS — I10 Essential (primary) hypertension: Secondary | ICD-10-CM

## 2022-09-10 NOTE — Progress Notes (Signed)
Cardiology Consultation:    Date:  09/10/2022   ID:  Carmen Ramirez, DOB November 04, 1980, MRN [id number]  PCP:  Shellia Cleverly, PA  Cardiologist:  Gypsy Balsam, MD   Referring MD: Shellia Cleverly, [place]   Chief Complaint  Patient presents with   Clearance 09/16/2022    Left Foot Hardware removal Dr. Claudine Mouton    History of Present Illness:    Carmen Ramirez is a 41 y.o. female who is being seen today for the evaluation of cardiovascular preop evaluation at the request of Beane, Lori M, [place].  Past medical history significant for essential hypertension, she also have abnormal EKG and she was evaluated for 8 in August 2020 that evaluation included echocardiogram which showed normal left ventricle ejection fraction without segmental wall motion abnormality, there were no left ventricle hypertrophy, we blame those changes on the EKG on repolarization abnormality secondary to LVH but there is no LVH criteria by EKG and there is no LVH criteria by echocardiogram.  She did have a fracture of the leg she did have some pinning done over there that need to be removed under general anesthesia after anesthesiologist find out that she is having some normal EKG she was referred back to [place].  Overall she is doing great she is completely asymptomatic there is no dizziness passing out there is no chest pain tightness squeezing pressure burning chest.  She is very active she have no difficulty with activity doing some exercises with no problems.  No family history of sudden cardiac death.  She does not smoke quit years ago overall perfectly asymptomatic  Past Medical History:  Diagnosis Date   ASCUS of cervix with negative high risk HPV 09/25/2021   Formatting of this note might be different from the original. Pap 3/22- ASCUS, HPV negative. Repeat in 12 months.   Bipolar depression (HCC) 02/05/2016   Under care of psychiatrist  Under care of psychiatrist   Bipolar disorder [facility]) 2017   COVID 10/31/2021   positive covid,  recovered, no current symptoms   Depression    Essential hypertension 02/05/2016   Headache    tx otc med   History of antiphospholipid antibody syndrome    testing after HELLP syndrome, no current meds   HTN (hypertension)    Lupus anticoagulant affecting pregnancy in third trimester, antepartum (HCC) 2008   MTHFR gene mutation 09/25/2021   Formatting of this note might be different from the original. H/o PreE and HELLP syndome Subsequent pregnancy required Lovenox. If pregnancy achieved, she will contact the office to start Lovenox.   Pain in left foot 11/28/2021   PVC (premature ventricular contraction)    no current problems   Sprain of left foot 11/28/2021    Past Surgical History:  Procedure Laterality Date   CESAREAN SECTION     x3 - 2008, 2010, 2012   OPEN REDUCTION INTERNAL FIXATION (ORIF) FOOT LISFRANC FRACTURE Left 11/29/2021   Procedure: OPEN REDUCTION INTERNAL FIXATION (ORIF) FOOT LISFRANC;  Surgeon: Netta Cedars, MD;  Location: [facility] OR;  Service: Orthopedics;  Laterality: Left;   TUBAL LIGATION  2012   WISDOM TOOTH EXTRACTION      Current Medications: Current Meds  Medication Sig   aspirin EC 325 MG tablet Take 325 mg by mouth daily.   buPROPion (WELLBUTRIN XL) 150 MG 24 hr tablet Take 150 mg by mouth daily.   escitalopram (LEXAPRO) 20 MG tablet Take 20 mg by mouth daily.   Nebivolol HCl 20 MG TABS Take 20  mg by mouth daily.   Oxcarbazepine (TRILEPTAL) 300 MG tablet Take 300 mg by mouth 3 (three) times daily.   QUEtiapine (SEROQUEL) 100 MG tablet Take 100 mg by mouth daily as needed for sleep.     Allergies:   Penicillins and Sulfa antibiotics   Social History   Socioeconomic History   Marital status: Divorced    Spouse name: Not on file   Number of children: Not on file   Years of education: Not on file   Highest education level: Not on file  Occupational History   Not on file  Tobacco Use   Smoking status: Former    Packs/day: 0.50    Years:  10.00    Total pack years: 5.00    Types: Cigarettes    Quit date: 2015    Years since quitting: 8.9   Smokeless tobacco: Current  Vaping Use   Vaping Use: Every day   Last attempt to quit: 10/12/2016   Substances: Nicotine, Flavoring, Nicotine-salt  Substance and Sexual Activity   Alcohol use: Yes    Alcohol/week: 4.0 - 6.0 standard drinks of alcohol    Types: 4 - 6 Glasses of wine per week    Comment: social wine   Drug use: Never   Sexual activity: Yes    Birth control/protection: Surgical    Comment: Tubal Ligation  Other Topics Concern   Not on file  Social History Narrative   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 ROS:   Please see the history of present illness.    All 14 point review of systems negative except as described per history of present illness.  EKGs/Labs/Other Studies Reviewed:    The following studies were reviewed today: Echocardiogram from 2020 reviewed showing no LVH  EKG:  EKG is  ordered today.  The ekg ordered today demonstrates normal sinus rhythm normal P interval normal QS complex duration morphology, and there is inversion in inferior lateral leads  Recent Labs: No results found for requested labs within last 365 days.  Recent Lipid Panel No results found for: "CHOL", "TRIG", "HDL", "CHOLHDL", "VLDL", "LDLCALC", "LDLDIRECT"  Physical Exam:    VS:  BP 98/68 (BP Location: Right Arm, Patient Position: Sitting)   Pulse 76   Ht 5\' 7"  (1.702 m)   Wt 160 lb 9.6 oz (72.8 kg)   SpO2 99%   BMI 25.15 kg/m     Wt  Readings from Last 3 Encounters:  09/10/22 160 lb 9.6 oz (72.8 kg)  11/29/21 150 lb (68 kg)  12/14/20 145 lb (65.8 kg)     GEN:  Well nourished, well developed in no acute distress HEENT: Normal NECK: No JVD; No carotid bruits LYMPHATICS: No lymphadenopathy CARDIAC: RRR, no murmurs, no rubs, no gallops RESPIRATORY:  Clear to auscultation without rales, wheezing or rhonchi  ABDOMEN: Soft, non-tender, non-distended MUSCULOSKELETAL:  No edema; No deformity  SKIN: Warm and dry NEUROLOGIC:  Alert and oriented x 3 PSYCHIATRIC:  Normal affect   ASSESSMENT:    1. Essential hypertension   2. Nonspecific abnormal electrocardiogram (ECG) (EKG)  3. Sprain of left foot, sequela    PLAN:    In order of problems listed above:  Essential hypertension blood pressure actually is low today.  I see some discrepancy here.  She does not have evidence of LVH on the EKG, she does have evidence of LVH on the echocardiogram she does have T inversions symmetrical with blamed this on repolarization abnormality but this is the same time lack of LVH make me worry we had a long discussion about what to do with the situation, I will schedule her to have echocardiogram to assess left ventricle ejection fraction, also will schedule her to have a heart MRI we will be able to measure muscle mass as well as precisely thickness of the right ventricle and left ventricle wall. Cardiovascular preop evaluation, will get echocardiogram before surgery.  But I do not see any contraindication for surgery schedule as long as echocardiogram will not show any significant changes. Cholesterol status I did review her blood test from before her LDL was 87 HDL 59. Abnormal EKG those EKG changes could not be interpreted as ischemic, however she have absolutely no signs and symptoms of ischemia.  On top of that she does not have much risk factors for the cholesterol is perfect she quit smoking does have high blood pressure but seems to be  well-controlled.   Medication Adjustments/Labs and Tests Ordered: Current medicines are reviewed at length with the patient today.  Concerns regarding medicines are outlined above.  No orders of the defined types were placed in this encounter.  No orders of the defined types were placed in this encounter.   Signed, Georgeanna Lea, MD, [facility]. 09/10/2022 9:58 AM    [facility]

## 2022-09-10 NOTE — Patient Instructions (Addendum)
Medication Instructions:  Your physician recommends that you continue on your current medications as directed. Please refer to the Current Medication list given to you today.  *If you need a refill on your cardiac medications before your next appointment, please call your pharmacy*   Lab Work: Your physician recommends that you return for lab work in:   Labs today: Hemoglobin and Hematocrit  If you have labs (blood work) drawn today and your tests are completely normal, you will receive your results only by: MyChart Message (if you have MyChart) OR A paper copy in the mail If you have any lab test that is abnormal or we need to change your treatment, we will call you to review the results.   Testing/Procedures: Your physician has requested that you have an echocardiogram. Echocardiography is a painless test that uses sound waves to create images of your heart. It provides your doctor with information about the size and shape of your heart and how well your heart's chambers and valves are working. This procedure takes approximately one hour. There are no restrictions for this procedure. Please do NOT wear cologne, perfume, aftershave, or lotions (deodorant is allowed). Please arrive 15 minutes prior to your appointment time.  MRI heart    Follow-Up: At [facility], you and your health needs are our priority.  As part of our continuing mission to provide you with exceptional heart care, we have created designated Provider Care Teams.  These Care Teams include your primary Cardiologist (physician) and Advanced Practice Providers (APPs -  Physician Assistants and Nurse Practitioners) who all work together to provide you with the care you need, when you need it.  We recommend signing up for the patient portal called "MyChart".  Sign up information is provided on this After Visit Summary.  MyChart is used to connect with patients for Virtual Visits (Telemedicine).  Patients are able  to view lab/test results, encounter notes, upcoming appointments, etc.  Non-urgent messages can be sent to your provider as well.   To learn more about what you can do with MyChart, go to [email or web address].    Your next appointment:   6 month(s)  The format for your next appointment:   In Person  Provider:   Gypsy Balsam, MD    Other Instructions None  Important Information About Sugar

## 2022-09-10 NOTE — Addendum Note (Signed)
Addended by: Roxanne Mins I on: 09/10/2022 10:31 AM   Modules accepted: Orders

## 2022-09-11 LAB — CBC
Hematocrit: 43.3 % (ref 34.0–46.6)
Hemoglobin: 14.8 g/dL (ref 11.1–15.9)
MCH: 30.8 pg (ref 26.6–33.0)
MCHC: 34.2 g/dL (ref 31.5–35.7)
MCV: 90 fL (ref 79–97)
Platelets: 235 10*3/uL (ref 150–450)
RBC: 4.81 x10E6/uL (ref 3.77–5.28)
RDW: 12.1 % (ref 11.7–15.4)
WBC: 5.8 10*3/uL (ref 3.4–10.8)

## 2022-09-14 ENCOUNTER — Ambulatory Visit: Payer: Commercial Managed Care - PPO | Attending: Cardiology

## 2022-09-14 DIAGNOSIS — R9431 Abnormal electrocardiogram [ECG] [EKG]: Secondary | ICD-10-CM | POA: Diagnosis not present

## 2022-09-14 DIAGNOSIS — I10 Essential (primary) hypertension: Secondary | ICD-10-CM | POA: Diagnosis not present

## 2022-09-14 DIAGNOSIS — S93602S Unspecified sprain of left foot, sequela: Secondary | ICD-10-CM

## 2022-09-14 LAB — ECHOCARDIOGRAM COMPLETE
Area-P 1/2: 4.44 cm2
S' Lateral: 3.1 cm

## 2022-09-17 ENCOUNTER — Encounter: Payer: Self-pay | Admitting: Cardiology

## 2022-09-21 NOTE — Telephone Encounter (Signed)
LVM regarding normal results per DPR - per Dr. Vanetta Shawl note. Encouraged pt to call with any questions or concerns.

## 2022-09-23 ENCOUNTER — Other Ambulatory Visit: Payer: Self-pay

## 2022-09-23 ENCOUNTER — Encounter (HOSPITAL_BASED_OUTPATIENT_CLINIC_OR_DEPARTMENT_OTHER): Payer: Self-pay | Admitting: Orthopaedic Surgery

## 2022-09-23 NOTE — Progress Notes (Addendum)
   09/23/22 1021  PAT Phone Screen  Is the patient taking a GLP-1 receptor agonist? No  Do You Have Diabetes? No  Do You Have Hypertension? Yes  Have You Ever Been to the ER for Asthma? No  Have You Taken Oral Steroids in the Past 3 Months? No  Do you Take Phenteramine or any Other Diet Drugs? No  Recent  Lab Work, EKG, CXR? (S)  Yes (see EPIC)  Do you have a history of heart problems? (S)  Yes  Have you ever had tests on your heart? (S)  Yes  What cardiac tests were performed? (S)  Echo;EKG (see EPIC)  What date/year were cardiac tests completed? EKG 09/10/22; ECHO 09/14/22  Results viewable: [facility] Media Tab  Any Recent Hospitalizations? No  Height 5\' 7"  (1.702 m)  Weight 70.3 kg  Pat Appointment Scheduled No  Reason for No Appointment Not Needed   History of MHTFR gene mutation. Chart reviewed with Dr. .

## 2022-09-29 NOTE — Discharge Instructions (Signed)
Netta Cedars, MD [facility]  Please read the following information regarding your care after surgery.  Medications  You only need a prescription for the narcotic pain medicine (ex. oxycodone, Percocet, Norco).  All of the other medicines listed below are available over the counter. ? Aleve 2 pills twice a day for the first 3 days after surgery. ? acetominophen (Tylenol) 650 mg every 4-6 hours as you need for minor to moderate pain ? oxycodone as prescribed for severe pain  No Tylenol until after 1:30pm today. No Ibuprofen until after 3:00pm today.  ? To help prevent blood clots, take aspirin (81 mg) twice daily for 28 days after surgery.  You should also get up every hour while you are awake to move around.  Weight Bearing ? Do NOT bear any weight on the operated leg or foot. This means do NOT touch your surgical leg to the ground!  Cast / Splint / Dressing ? If you have a splint, do NOT remove this. Keep your splint, cast or dressing clean and dry.  Don't put anything (coat hanger, pencil, etc) down inside of it.  If it gets wet, call the office immediately to schedule an appointment for a cast change.  Swelling IMPORTANT: It is normal for you to have swelling where you had surgery. To reduce swelling and pain, keep at least 3 pillows under your leg so that your toes are above your nose and your heel is above the level of your hip.  It may be necessary to keep your foot or leg elevated for several weeks.  This is critical to helping your incisions heal and your pain to feel better.  Follow Up Call my office at [phone] when you are discharged from the hospital or surgery center to schedule an appointment to be seen 7-10 days after surgery.  Call my office at [phone] if you develop a fever >101.5 F, nausea, vomiting, bleeding from the surgical site or severe pain.     Post Anesthesia Home Care Instructions  Activity: Get plenty of rest for the remainder of the day. A  responsible individual must stay with you for 24 hours following the procedure.  For the next 24 hours, DO NOT: -Drive a car -Advertising copywriter -Drink alcoholic beverages -Take any medication unless instructed by your physician -Make any legal decisions or sign important papers.  Meals: Start with liquid foods such as gelatin or soup. Progress to regular foods as tolerated. Avoid greasy, spicy, heavy foods. If nausea and/or vomiting occur, drink only clear liquids until the nausea and/or vomiting subsides. Call your physician if vomiting continues.  Special Instructions/Symptoms: Your throat may feel dry or sore from the anesthesia or the breathing tube placed in your throat during surgery. If this causes discomfort, gargle with warm salt water. The discomfort should disappear within 24 hours.  If you had a scopolamine patch placed behind your ear for the management of post- operative nausea and/or vomiting:  1. The medication in the patch is effective for 72 hours, after which it should be removed.  Wrap patch in a tissue and discard in the trash. Wash hands thoroughly with soap and water. 2. You may remove the patch earlier than 72 hours if you experience unpleasant side effects which may include dry mouth, dizziness or visual disturbances. 3. Avoid touching the patch. Wash your hands with soap and water after contact with the patch.

## 2022-09-29 NOTE — H&P (Addendum)
ORTHOPAEDIC SURGERY H&P  Subjective:  The patient presents with left foot hardware s/p Lisfranc ORIF 11/29/21.   Past Medical History:  Diagnosis Date   ASCUS of cervix with negative high risk HPV 09/25/2021   Formatting of this note might be different from the original. Pap 3/22- ASCUS, HPV negative. Repeat in 12 months.   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 H/o PreE and HELLP syndome Subsequent pregnancy required Lovenox. If pregnancy achieved, she will contact the office to start Lovenox.   Pain in left foot 11/28/2021   PVC (premature ventricular contraction)    no current problems   Sprain of left foot 11/28/2021    Past Surgical History:  Procedure Laterality Date   CESAREAN SECTION     x3 - 2008, 2010, 2012   OPEN REDUCTION INTERNAL FIXATION (ORIF) FOOT LISFRANC FRACTURE Left 11/29/2021   Procedure: OPEN REDUCTION INTERNAL FIXATION (ORIF) FOOT LISFRANC;  Surgeon: Netta Cedars, MD;  Location: [facility] OR;  Service: Orthopedics;  Laterality: Left;   TUBAL LIGATION  2012   WISDOM TOOTH EXTRACTION       (Not in an outpatient encounter)    Allergies  Allergen Reactions   Penicillins Rash   Sulfa Antibiotics Rash    Social History   Socioeconomic History   Marital status: Divorced    Spouse name: Not on file   Number of children: Not on file   Years of education: Not on file   Highest education level: Not on file  Occupational History    Not on file  Tobacco Use   Smoking status: Former    Packs/day: 0.50    Years: 10.00    Total pack years: 5.00    Types: Cigarettes    Quit date: 2015    Years since quitting: 8.9   Smokeless tobacco: Current  Vaping Use   Vaping Use: Not on file  Substance and Sexual Activity   Alcohol use: Yes    Alcohol/week: 4.0 - 6.0 standard drinks of alcohol    Types: 4 - 6 Glasses of wine per week    Comment: social wine   Drug use: Never   Sexual activity: Yes    Birth control/protection: Surgical    Comment: Tubal Ligation  Other Topics Concern   Not on file  Social History Narrative   Works as NP in [employer]   Social Determinants of Corporate investment banker Strain: Not on file  Food Insecurity: Not on file  Transportation Needs: Not on file  Physical Activity: Not on file  Stress: Not on file  Social Connections: Not on file  Intimate Partner Violence: Not on file     History reviewed. No pertinent family history.   Review of Systems Pertinent items are noted in HPI.  Objective: Vital signs in last 24 hours:    09/23/2022   10:21 AM 09/10/2022    9:37 AM 11/29/2021    1:06 PM  Vitals with BMI  Height 5\' 7"  5\' 7"    Weight 155 lbs 160 lbs 10 oz   BMI 24.27 25.15   Systolic  98 101  Diastolic  68 71  Pulse  76 81      EXAM: General: Well nourished, well developed. Awake, alert and oriented to time, place, person. Normal mood and affect. No apparent distress. Breathing room air.  Operative Lower Extremity: Alignment - Neutral Deformity - None Skin intact, well healed scar Tenderness to palpation - None Normal range of motion 5/5 TA, PT, GS, Per, EHL, FHL Sensation intact to light touch throughout Palpable DP and PT pulses Special testing: None  The contralateral foot/ankle was examined for comparison and noted to be neurovascularly intact with no localized deformity, swelling, or tenderness.  Imaging Review All images taken were independently  reviewed by me.  Assessment/Plan: The clinical and radiographic findings were reviewed and discussed at length with the patient.  The patient has left foot hardware s/p Lisfranc ORIF 11/29/21.  We spoke at length about the natural course of these findings. We discussed nonoperative and operative treatment options in detail.  The risks and benefits were presented and reviewed. The risks due to recurrent instability, partial/total inability to remove hardware, implant failure/irritation, infection, stiffness, nerve/vessel/tendon injury, wound healing issues, failure of this surgery, need for further surgery, thromboembolic events, amputation, death among others were discussed. The patient acknowledged the explanation and agreed to proceed with the plan.   Orthopaedic Surgery [facility]

## 2022-09-29 NOTE — Anesthesia Preprocedure Evaluation (Signed)
Anesthesia Evaluation  Patient identified by MRN, date of birth, ID band Patient awake    Reviewed: Allergy & Precautions, NPO status , Patient's Chart, lab work & pertinent test results  History of Anesthesia Complications Negative for: history of anesthetic complications  Airway Mallampati: I  TM Distance: >3 FB Neck ROM: Full    Dental  (+) Teeth Intact, Dental Advisory Given   Pulmonary former smoker   Pulmonary exam normal        Cardiovascular hypertension, Normal cardiovascular exam     Neuro/Psych    Depression Bipolar Disorder   negative neurological ROS     GI/Hepatic negative GI ROS, Neg liver ROS,,,  Endo/Other  negative endocrine ROS    Renal/GU negative Renal ROS  negative genitourinary   Musculoskeletal negative musculoskeletal ROS (+)    Abdominal   Peds  Hematology negative hematology ROS (+)   Anesthesia Other Findings   Reproductive/Obstetrics                             Anesthesia Physical Anesthesia Plan  ASA: 2  Anesthesia Plan: General   Post-op Pain Management: Tylenol PO (pre-op)* and Toradol IV (intra-op)*   Induction: Intravenous  PONV Risk Score and Plan: 3 and Ondansetron, Dexamethasone, Midazolam and Treatment may vary due to age or medical condition  Airway Management Planned: LMA  Additional Equipment: None  Intra-op Plan:   Post-operative Plan: Extubation in OR  Informed Consent: I have reviewed the patients History and Physical, chart, labs and discussed the procedure including the risks, benefits and alternatives for the proposed anesthesia with the patient or authorized representative who has indicated his/her understanding and acceptance.     Dental advisory given  Plan Discussed with:   Anesthesia Plan Comments:        Anesthesia Quick Evaluation

## 2022-09-30 ENCOUNTER — Other Ambulatory Visit: Payer: Self-pay

## 2022-09-30 ENCOUNTER — Ambulatory Visit (HOSPITAL_COMMUNITY): Payer: Commercial Managed Care - PPO

## 2022-09-30 ENCOUNTER — Ambulatory Visit (HOSPITAL_BASED_OUTPATIENT_CLINIC_OR_DEPARTMENT_OTHER): Payer: Commercial Managed Care - PPO | Admitting: Anesthesiology

## 2022-09-30 ENCOUNTER — Ambulatory Visit (HOSPITAL_BASED_OUTPATIENT_CLINIC_OR_DEPARTMENT_OTHER): Payer: Commercial Managed Care - PPO

## 2022-09-30 ENCOUNTER — Encounter (HOSPITAL_BASED_OUTPATIENT_CLINIC_OR_DEPARTMENT_OTHER)
Admission: RE | Disposition: A | Discharge status: Home / Self Care | Payer: Self-pay | Source: Home / Self Care | Attending: Orthopaedic Surgery

## 2022-09-30 ENCOUNTER — Ambulatory Visit (HOSPITAL_BASED_OUTPATIENT_CLINIC_OR_DEPARTMENT_OTHER)
Admission: RE | Admit: 2022-09-30 | Discharge: 2022-09-30 | Disposition: A | Discharge status: Home / Self Care | Payer: Commercial Managed Care - PPO | Attending: Orthopaedic Surgery | Admitting: Orthopaedic Surgery

## 2022-09-30 ENCOUNTER — Encounter (HOSPITAL_BASED_OUTPATIENT_CLINIC_OR_DEPARTMENT_OTHER): Payer: Self-pay | Admitting: Orthopaedic Surgery

## 2022-09-30 DIAGNOSIS — X58XXXS Exposure to other specified factors, sequela: Secondary | ICD-10-CM | POA: Insufficient documentation

## 2022-09-30 DIAGNOSIS — S93602D Unspecified sprain of left foot, subsequent encounter: Secondary | ICD-10-CM

## 2022-09-30 DIAGNOSIS — I10 Essential (primary) hypertension: Secondary | ICD-10-CM | POA: Insufficient documentation

## 2022-09-30 DIAGNOSIS — F3130 Bipolar disorder, current episode depressed, mild or moderate severity, unspecified: Secondary | ICD-10-CM | POA: Insufficient documentation

## 2022-09-30 DIAGNOSIS — S92812S Other fracture of left foot, sequela: Secondary | ICD-10-CM | POA: Insufficient documentation

## 2022-09-30 DIAGNOSIS — Z87891 Personal history of nicotine dependence: Secondary | ICD-10-CM | POA: Diagnosis not present

## 2022-09-30 DIAGNOSIS — Z01818 Encounter for other preprocedural examination: Secondary | ICD-10-CM

## 2022-09-30 DIAGNOSIS — M79672 Pain in left foot: Secondary | ICD-10-CM

## 2022-09-30 HISTORY — PX: HARDWARE REMOVAL: SHX979

## 2022-09-30 LAB — POCT PREGNANCY, URINE: Preg Test, Ur: NEGATIVE

## 2022-09-30 LAB — SURGICAL PCR SCREEN
MRSA, PCR: NEGATIVE
Staphylococcus aureus: NEGATIVE

## 2022-09-30 SURGERY — REMOVAL, HARDWARE
Anesthesia: General | Site: Foot | Laterality: Left

## 2022-09-30 MED ORDER — LIDOCAINE 2% (20 MG/ML) 5 ML SYRINGE
INTRAMUSCULAR | Status: DC | PRN
  Administered 2022-09-30: 100 mg via INTRAVENOUS

## 2022-09-30 MED ORDER — BUPIVACAINE HCL (PF) 0.25 % IJ SOLN
INTRAMUSCULAR | Status: AC
  Filled 2022-09-30: qty 30

## 2022-09-30 MED ORDER — MIDAZOLAM HCL 5 MG/5ML IJ SOLN
INTRAMUSCULAR | Status: DC | PRN
  Administered 2022-09-30: 2 mg via INTRAVENOUS

## 2022-09-30 MED ORDER — LIDOCAINE 2% (20 MG/ML) 5 ML SYRINGE
INTRAMUSCULAR | Status: AC
  Filled 2022-09-30: qty 5

## 2022-09-30 MED ORDER — VANCOMYCIN HCL IN DEXTROSE 1-5 GM/200ML-% IV SOLN
1000.0000 mg | INTRAVENOUS | Status: AC
  Administered 2022-09-30: 1000 mg via INTRAVENOUS

## 2022-09-30 MED ORDER — DEXAMETHASONE SODIUM PHOSPHATE 10 MG/ML IJ SOLN
INTRAMUSCULAR | Status: DC | PRN
  Administered 2022-09-30: 10 mg via INTRAVENOUS

## 2022-09-30 MED ORDER — POVIDONE-IODINE 10 % EX SWAB: 2.0000 | Freq: Once | CUTANEOUS | Status: DC

## 2022-09-30 MED ORDER — 0.9 % SODIUM CHLORIDE (POUR BTL) OPTIME
TOPICAL | Status: DC | PRN
  Administered 2022-09-30: 1000 mL

## 2022-09-30 MED ORDER — ACETAMINOPHEN 500 MG PO TABS
1000.0000 mg | ORAL_TABLET | Freq: Once | ORAL | Status: AC
  Administered 2022-09-30: 1000 mg via ORAL

## 2022-09-30 MED ORDER — BUPIVACAINE HCL 0.25 % IJ SOLN
INTRAMUSCULAR | Status: DC | PRN
  Administered 2022-09-30: 10 mL

## 2022-09-30 MED ORDER — FENTANYL CITRATE (PF) 100 MCG/2ML IJ SOLN
INTRAMUSCULAR | Status: DC | PRN
  Administered 2022-09-30: 50 ug via INTRAVENOUS
  Administered 2022-09-30 (×2): 25 ug via INTRAVENOUS

## 2022-09-30 MED ORDER — ACETAMINOPHEN 500 MG PO TABS
ORAL_TABLET | ORAL | Status: AC
  Filled 2022-09-30: qty 2

## 2022-09-30 MED ORDER — KETOROLAC TROMETHAMINE 30 MG/ML IJ SOLN
INTRAMUSCULAR | Status: DC | PRN
  Administered 2022-09-30: 30 mg via INTRAVENOUS

## 2022-09-30 MED ORDER — PROPOFOL 500 MG/50ML IV EMUL
INTRAVENOUS | Status: AC
  Filled 2022-09-30: qty 50

## 2022-09-30 MED ORDER — LACTATED RINGERS IV SOLN: INTRAVENOUS | Status: DC

## 2022-09-30 MED ORDER — CIPROFLOXACIN IN D5W 400 MG/200ML IV SOLN
INTRAVENOUS | Status: AC
  Filled 2022-09-30: qty 200

## 2022-09-30 MED ORDER — CIPROFLOXACIN IN D5W 400 MG/200ML IV SOLN
400.0000 mg | Freq: Once | INTRAVENOUS | Status: AC
  Administered 2022-09-30: 400 mg via INTRAVENOUS

## 2022-09-30 MED ORDER — FENTANYL CITRATE (PF) 100 MCG/2ML IJ SOLN: 25.0000 ug | INTRAMUSCULAR | Status: DC | PRN

## 2022-09-30 MED ORDER — ONDANSETRON HCL 4 MG/2ML IJ SOLN
INTRAMUSCULAR | Status: AC
  Filled 2022-09-30: qty 2

## 2022-09-30 MED ORDER — VANCOMYCIN HCL IN DEXTROSE 1-5 GM/200ML-% IV SOLN
INTRAVENOUS | Status: AC
  Filled 2022-09-30: qty 200

## 2022-09-30 MED ORDER — OXYCODONE HCL 5 MG PO TABS: 5.0000 mg | ORAL_TABLET | Freq: Once | ORAL | Status: DC | PRN

## 2022-09-30 MED ORDER — OXYCODONE HCL 5 MG/5ML PO SOLN: 5.0000 mg | Freq: Once | ORAL | Status: DC | PRN

## 2022-09-30 MED ORDER — VANCOMYCIN HCL 1000 MG IV SOLR
INTRAVENOUS | Status: AC
  Filled 2022-09-30: qty 20

## 2022-09-30 MED ORDER — EPHEDRINE SULFATE (PRESSORS) 50 MG/ML IJ SOLN
INTRAMUSCULAR | Status: DC | PRN
  Administered 2022-09-30: 5 mg via INTRAVENOUS
  Administered 2022-09-30: 10 mg via INTRAVENOUS
  Administered 2022-09-30: 5 mg via INTRAVENOUS

## 2022-09-30 MED ORDER — FENTANYL CITRATE (PF) 100 MCG/2ML IJ SOLN
INTRAMUSCULAR | Status: AC
  Filled 2022-09-30: qty 2

## 2022-09-30 MED ORDER — PROPOFOL 10 MG/ML IV BOLUS
INTRAVENOUS | Status: DC | PRN
  Administered 2022-09-30: 200 mg via INTRAVENOUS

## 2022-09-30 MED ORDER — MIDAZOLAM HCL 2 MG/2ML IJ SOLN
INTRAMUSCULAR | Status: AC
  Filled 2022-09-30: qty 2

## 2022-09-30 MED ORDER — ONDANSETRON HCL 4 MG/2ML IJ SOLN: 4.0000 mg | Freq: Once | INTRAMUSCULAR | Status: DC | PRN

## 2022-09-30 MED ORDER — DEXAMETHASONE SODIUM PHOSPHATE 10 MG/ML IJ SOLN
INTRAMUSCULAR | Status: AC
  Filled 2022-09-30: qty 1

## 2022-09-30 MED ORDER — ONDANSETRON HCL 4 MG/2ML IJ SOLN
INTRAMUSCULAR | Status: DC | PRN
  Administered 2022-09-30: 4 mg via INTRAVENOUS

## 2022-09-30 MED ORDER — AMISULPRIDE (ANTIEMETIC) 5 MG/2ML IV SOLN: 10.0000 mg | Freq: Once | INTRAVENOUS | Status: DC | PRN

## 2022-09-30 MED ORDER — VANCOMYCIN HCL 500 MG IV SOLR
INTRAVENOUS | Status: DC | PRN
  Administered 2022-09-30: 1000 mg via TOPICAL

## 2022-09-30 MED ORDER — KETOROLAC TROMETHAMINE 30 MG/ML IJ SOLN
INTRAMUSCULAR | Status: AC
  Filled 2022-09-30: qty 1

## 2022-09-30 MED ORDER — CHLORHEXIDINE GLUCONATE 4 % EX LIQD: 60.0000 mL | Freq: Once | CUTANEOUS | Status: DC

## 2022-09-30 SURGICAL SUPPLY — 69 items
APL PRP STRL LF DISP 70% ISPRP (MISCELLANEOUS) ×2
BANDAGE ESMARK 6X9 LF (GAUZE/BANDAGES/DRESSINGS) ×1 IMPLANT
BLADE SURG 15 STRL LF DISP TIS (BLADE) ×4 IMPLANT
BLADE SURG 15 STRL SS (BLADE) ×4
BNDG CMPR 5X4 CHSV STRCH STRL (GAUZE/BANDAGES/DRESSINGS) ×1
BNDG CMPR 9X6 STRL LF SNTH (GAUZE/BANDAGES/DRESSINGS) ×1
BNDG COHESIVE 4X5 TAN STRL LF (GAUZE/BANDAGES/DRESSINGS) ×1 IMPLANT
BNDG ELASTIC 4X5.8 VLCR STR LF (GAUZE/BANDAGES/DRESSINGS) ×1 IMPLANT
BNDG ELASTIC 6X5.8 VLCR STR LF (GAUZE/BANDAGES/DRESSINGS) ×1 IMPLANT
BNDG ESMARK 6X9 LF (GAUZE/BANDAGES/DRESSINGS) ×1
BRUSH SCRUB EZ  4% CHG (MISCELLANEOUS) ×1
BRUSH SCRUB EZ 4% CHG (MISCELLANEOUS) ×1 IMPLANT
CANISTER SUCT 1200ML W/VALVE (MISCELLANEOUS) ×1 IMPLANT
CHLORAPREP W/TINT 26 (MISCELLANEOUS) ×2 IMPLANT
COVER BACK TABLE 60X90IN (DRAPES) ×1 IMPLANT
CUFF TOURN SGL QUICK 34 (TOURNIQUET CUFF) ×1
CUFF TRNQT CYL 34X4.125X (TOURNIQUET CUFF) IMPLANT
DRAPE C-ARM 42X72 X-RAY (DRAPES) ×1 IMPLANT
DRAPE C-ARMOR (DRAPES) ×1 IMPLANT
DRAPE EXTREMITY T 121X128X90 (DISPOSABLE) ×1 IMPLANT
DRAPE IMP U-DRAPE 54X76 (DRAPES) ×1 IMPLANT
DRAPE U-SHAPE 47X51 STRL (DRAPES) ×1 IMPLANT
DRSG MEPITEL 4X7.2 (GAUZE/BANDAGES/DRESSINGS) ×1 IMPLANT
ELECT REM PT RETURN 9FT ADLT (ELECTROSURGICAL) ×1
ELECTRODE REM PT RTRN 9FT ADLT (ELECTROSURGICAL) ×1 IMPLANT
GAUZE PAD ABD 8X10 STRL (GAUZE/BANDAGES/DRESSINGS) IMPLANT
GAUZE SPONGE 4X4 12PLY STRL (GAUZE/BANDAGES/DRESSINGS) ×1 IMPLANT
GLOVE BIOGEL PI IND STRL 8 (GLOVE) ×1 IMPLANT
GLOVE SURG SS PI 7.5 STRL IVOR (GLOVE) ×2 IMPLANT
GOWN STRL REUS W/ TWL LRG LVL3 (GOWN DISPOSABLE) ×2 IMPLANT
GOWN STRL REUS W/TWL LRG LVL3 (GOWN DISPOSABLE) ×2
K-WIRE .062 (WIRE) ×1
K-WIRE FX6X.062X2 END TROC (WIRE) ×1
KWIRE FX6X.062X2 END TROC (WIRE) IMPLANT
MARKER SKIN DUAL TIP RULER LAB (MISCELLANEOUS) IMPLANT
NDL HYPO 25X1 1.5 SAFETY (NEEDLE) IMPLANT
NEEDLE HYPO 25X1 1.5 SAFETY (NEEDLE) IMPLANT
NS IRRIG 1000ML POUR BTL (IV SOLUTION) ×1 IMPLANT
PACK BASIN DAY SURGERY FS (CUSTOM PROCEDURE TRAY) ×1 IMPLANT
PAD CAST 4YDX4 CTTN HI CHSV (CAST SUPPLIES) ×1 IMPLANT
PADDING CAST ABS COTTON 4X4 ST (CAST SUPPLIES) IMPLANT
PADDING CAST COTTON 4X4 STRL (CAST SUPPLIES) ×1
PADDING CAST COTTON 6X4 STRL (CAST SUPPLIES) ×1 IMPLANT
PADDING CAST SYNTHETIC 4X4 STR (CAST SUPPLIES) ×4 IMPLANT
PENCIL SMOKE EVACUATOR (MISCELLANEOUS) ×1 IMPLANT
SHEET MEDIUM DRAPE 40X70 STRL (DRAPES) ×1 IMPLANT
SLEEVE SCD COMPRESS KNEE MED (STOCKING) ×1 IMPLANT
SPIKE FLUID TRANSFER (MISCELLANEOUS) IMPLANT
SPLINT PLASTER CAST FAST 5X30 (CAST SUPPLIES) IMPLANT
SPONGE T-LAP 18X18 ~~LOC~~+RFID (SPONGE) ×1 IMPLANT
STOCKINETTE 6  STRL (DRAPES) ×1
STOCKINETTE 6 STRL (DRAPES) ×1 IMPLANT
STOCKINETTE ORTHO 6X25 (MISCELLANEOUS) ×1 IMPLANT
SUCTION FRAZIER HANDLE 10FR (MISCELLANEOUS)
SUCTION TUBE FRAZIER 10FR DISP (MISCELLANEOUS) IMPLANT
SUT ETHILON 2 0 FS 18 (SUTURE) ×2 IMPLANT
SUT MNCRL AB 3-0 PS2 18 (SUTURE) ×1 IMPLANT
SUT PDS 3-0 CT2 (SUTURE) ×1
SUT PDS II 3-0 CT2 27 ABS (SUTURE) IMPLANT
SUT VIC AB 2-0 SH 27 (SUTURE) ×1
SUT VIC AB 2-0 SH 27XBRD (SUTURE) ×1 IMPLANT
SUT VIC AB 3-0 SH 27 (SUTURE)
SUT VIC AB 3-0 SH 27X BRD (SUTURE) IMPLANT
SUT VICRYL 0 SH 27 (SUTURE) IMPLANT
SYR BULB IRRIG 60ML STRL (SYRINGE) ×1 IMPLANT
SYR CONTROL 10ML LL (SYRINGE) IMPLANT
TOWEL GREEN STERILE FF (TOWEL DISPOSABLE) ×2 IMPLANT
TUBE CONNECTING 20X1/4 (TUBING) ×1 IMPLANT
UNDERPAD 30X36 HEAVY ABSORB (UNDERPADS AND DIAPERS) ×1 IMPLANT

## 2022-09-30 NOTE — Anesthesia Postprocedure Evaluation (Signed)
Anesthesia Post Note  Patient: Donnalynn Wheeless  Procedure(s) Performed: HARDWARE REMOVAL, LEFT FOOT (Left: Foot)     Patient location during evaluation: PACU Anesthesia Type: General Level of consciousness: awake and alert Pain management: pain level controlled Vital Signs Assessment: post-procedure vital signs reviewed and stable Respiratory status: spontaneous breathing, nonlabored ventilation and respiratory function stable Cardiovascular status: blood pressure returned to baseline and stable Postop Assessment: no apparent nausea or vomiting Anesthetic complications: no   No notable events documented.  Last Vitals:  Vitals:   09/30/22 1000 09/30/22 1025  BP: 109/79 (!) 110/95  Pulse: 91 86  Resp:  18  Temp:  36.8 C  SpO2: 99% 97%    Last Pain:  Vitals:   09/30/22 1000  TempSrc:   PainSc: 0-No pain                 Lucretia Kern

## 2022-09-30 NOTE — Anesthesia Procedure Notes (Addendum)
Procedure Name: LMA Insertion Date/Time: 09/30/2022 8:31 AM  Performed by: Burna Cash, CRNAPre-anesthesia Checklist: Patient identified, Emergency Drugs available, Suction available and Patient being monitored Patient Re-evaluated:Patient Re-evaluated prior to induction Oxygen Delivery Method: Circle system utilized Preoxygenation: Pre-oxygenation with 100% oxygen Induction Type: IV induction Ventilation: Mask ventilation without difficulty LMA: LMA inserted LMA Size: 4.0 Number of attempts: 1 Airway Equipment and Method: Bite block Placement Confirmation: positive ETCO2 Tube secured with: Tape Dental Injury: Teeth and Oropharynx as per pre-operative assessment

## 2022-09-30 NOTE — Transfer of Care (Signed)
Immediate Anesthesia Transfer of Care Note  Patient: Carmen Ramirez  Procedure(s) Performed: HARDWARE REMOVAL, LEFT FOOT (Left: Foot)  Patient Location: PACU  Anesthesia Type:General  Level of Consciousness: sedated  Airway & Oxygen Therapy: Patient Spontanous Breathing and Patient connected to face mask oxygen  Post-op Assessment: Report given to RN and Post -op Vital signs reviewed and stable  Post vital signs: Reviewed and stable  Last Vitals:  Vitals Value Taken Time  BP 98/70 09/30/22 0940  Temp    Pulse 78 09/30/22 0941  Resp 12 09/30/22 0941  SpO2 100 % 09/30/22 0941  Vitals shown include unvalidated device data.  Last Pain:  Vitals:   09/30/22 0731  TempSrc: Oral  PainSc: 0-No pain         Complications: No notable events documented.

## 2022-09-30 NOTE — Op Note (Signed)
09/30/2022  5:25 PM   PATIENT: Carmen Ramirez  41 y.o. female  MRN: [id number]   PRE-OPERATIVE DIAGNOSIS:   Left foot hardware s/p Lisfranc ORIF 11/29/21    POST-OPERATIVE DIAGNOSIS:   Same   PROCEDURE: Removal of left foot hardware (s/p Lisfranc and intercuneiform screws implanted 11/29/21)   SURGEON:  Netta Cedars, MD   ASSISTANT: None   ANESTHESIA: General, regional   EBL: Minimal   TOURNIQUET:    Total Tourniquet Time Documented: Thigh (Left) - 41 minutes Total: Thigh (Left) - 41 minutes    COMPLICATIONS: None apparent   DISPOSITION: Extubated, awake and stable to recovery.   INDICATION FOR PROCEDURE: The patient presented with left foot hardware s/p Lisfranc ORIF 11/29/21. She is doing excellent with full resumption of routine activities including [place]. We discussed elective removal of left Lisfranc screws and exam under anesthesia of midfoot to assess stability.  We discussed the diagnosis, alternative treatment options, risks and benefits of the above surgical intervention, as well as alternative non-operative treatments. All questions/concerns were addressed and the patient/family demonstrated appropriate understanding of the diagnosis, the procedure, the postoperative course, and overall prognosis. The patient wished to proceed with surgical intervention and signed an informed surgical consent as such, in each others presence prior to surgery.   PROCEDURE IN DETAIL: After preoperative consent was obtained and the correct operative site was identified, the patient was brought to the operating room supine on stretcher and transferred onto operating table. General anesthesia was induced. Preoperative antibiotics were administered. Surgical timeout was taken. The patient was then positioned supine with an ipsilateral hip bump. The operative lower extremity was prepped and draped in standard sterile fashion with a  tourniquet around the thigh. The extremity was exsanguinated and the tourniquet was inflated to 275 mmHg.  Prior medial approach was utilized over the screw heads and dissection carried down to the level of bone. Two solid 4.0 fully threaded headed screws were removed completely and stability of the midfoot was noted to clinical and fluoroscopic testing.    The surgical sites were thoroughly irrigated. The tourniquet was deflated and hemostasis achieved. Betadine solution was used to irrigate and vancomycin powder applied. The skin was closed without tension using 2-0 nylon suture.    The leg was cleaned with saline and sterile mepitel dressings with gauze were applied. A well padded sterile wrap was applied. The patient was awakened from anesthesia and transported to the recovery room in stable condition.     FOLLOW UP PLAN: -transfer to PACU, then home -strict heel weightbearing operative extremity, maximum elevation -maintain dressings and postop shoe until follow up -DVT ppx: Aspirin 81 mg twice daily x 28 days -follow up as outpatient on Tues 10/06/22 for wound check -sutures out in 3 weeks in outpatient office   RADIOGRAPHS: AP, lateral, oblique and stress radiographs of the left foot were obtained intraoperatively. These showed interval removal of both midfoot screws. Manual stress radiographs were taken and the foot was noted to be stable following hardware removal. No other acute injuries are noted.    Netta Cedars Orthopaedic Surgery [facility]

## 2022-09-30 NOTE — H&P (Signed)
H&P Update:  -History and Physical Reviewed  -Patient has been re-examined  -No change in the plan of care  -The risks and benefits were presented and reviewed. The risks due to recurrent instability, inability to remove part/all of hardware, residual hardware failure/irritation, new/persistent infection, stiffness, nerve/vessel/tendon injury, nonunion/malunion, wound healing issues, development of arthritis, failure of this surgery, possibility of external fixation with delayed definitive surgery, need for further surgery, thromboembolic events, anesthesia/medical complications, amputation, death among others were discussed. The patient acknowledged the explanation, agreed to proceed with the plan and a consent was signed.  Carmen Ramirez

## 2022-12-08 ENCOUNTER — Telehealth (HOSPITAL_COMMUNITY): Payer: Self-pay | Admitting: Emergency Medicine

## 2022-12-08 NOTE — Telephone Encounter (Signed)
Attempted to call patient regarding upcoming cardiac MR appointment. Left message on voicemail with name and callback number Lisaanne Lawrie RN Navigator Cardiac Imaging [facility] [phone] Office [phone] Cell  

## 2022-12-09 ENCOUNTER — Other Ambulatory Visit: Payer: Self-pay | Admitting: Cardiology

## 2022-12-09 ENCOUNTER — Ambulatory Visit (HOSPITAL_COMMUNITY)
Admission: RE | Admit: 2022-12-09 | Discharge: 2022-12-09 | Disposition: A | Discharge status: Home / Self Care | Payer: BC Managed Care – PPO | Source: Ambulatory Visit | Attending: Cardiology | Admitting: Cardiology

## 2022-12-09 DIAGNOSIS — I10 Essential (primary) hypertension: Secondary | ICD-10-CM | POA: Insufficient documentation

## 2022-12-09 DIAGNOSIS — S93602S Unspecified sprain of left foot, sequela: Secondary | ICD-10-CM | POA: Diagnosis present

## 2022-12-09 DIAGNOSIS — R9431 Abnormal electrocardiogram [ECG] [EKG]: Secondary | ICD-10-CM | POA: Insufficient documentation

## 2022-12-09 MED ORDER — GADOBUTROL 1 MMOL/ML IV SOLN
10.0000 mL | Freq: Once | INTRAVENOUS | Status: AC | PRN
  Administered 2022-12-09: 10 mL via INTRAVENOUS

## 2023-09-06 ENCOUNTER — Ambulatory Visit: Payer: BC Managed Care – PPO | Admitting: Cardiology

## 2023-09-28 NOTE — Progress Notes (Signed)
 " Cardiology Office Note:  .   Date:  09/30/2023  ID:  Carmen Ramirez, DOB Mar 10, 1981, MRN [id number] PCP: Duwayne Katheryn HERO, PA  [facility] Providers Cardiologist:  Lamar Fitch, MD    History of Present Illness: .   Carmen Ramirez is a 42 y.o. female with a past medical history of hypertension, bipolar disorder, MTHFR gene mutation, abnormal EKG.  12/09/2022 cardiac MRI revealed no abnormalities 09/14/2022 echo EF 65%, grade 1 DD, no valvular abnormalities  She establish care with Dr. Fitch on 09/10/2022, she was doing well at this time, very physically active, echocardiogram was arranged to assess her LV This revealed an EF of 60 to 65%, grade 1 DD, no valvular abnormalities.  A cardiac MRI was arranged which revealed no abnormalities.  She presents today for follow-up of her abnormal EKG.  She has been doing well from a cardiac perspective, she offers no formal complaints.  She stays very physically active working out approximately 2 hours each day. She denies chest pain, palpitations, dyspnea, pnd, orthopnea, n, v, dizziness, syncope, edema, weight gain, or early satiety.   ROS: Review of Systems  All other systems reviewed and are negative.   EKG Interpretation Date/Time:  Thursday September 30 2023 09:20:41 EST Ventricular Rate:  70 PR Interval:  132 QRS Duration:  92 QT Interval:  404 QTC Calculation: 436 R Axis:   61  Text Interpretation: Normal sinus rhythm - no changes T wave abnormality, consider inferior ischemia T wave abnormality, consider anterolateral ischemia Abnormal ECG When compared with ECG of 14-Dec-2020 19:06, PREVIOUS ECG IS PRESENT Confirmed by Carlin Nest [phone]) on 09/30/2023 8:21:01 AM   Studies Reviewed: .   Cardiac Studies & Procedures      ECHOCARDIOGRAM  ECHOCARDIOGRAM COMPLETE 09/14/2022  Narrative ECHOCARDIOGRAM REPORT    Patient Name:   Carmen Ramirez Date of Exam: 09/14/2022 Medical Rec #:  [id number]  Height:       67.0 in Accession #:     [id number] Weight:       160.6 lb Date of Birth:  05/09/1981   BSA:          1.842 m Patient Age:    41 years   BP:           98/68 mmHg Patient Gender: F          HR:           67 bpm. Exam Location:  [place]  Procedure: 2D Echo, Cardiac Doppler, Color Doppler and Strain Analysis  Indications:    Nonspecific abnormal electrocardiogram (ECG) (EKG) [R94.31  History:        Patient has no prior history of Echocardiogram examinations, most recent 05/29/2019. Risk Factors:Hypertension.  Sonographer:    Lynwood Silvas RDCS Referring Phys: [phone] LAMAR PARAS KRASOWSKI  IMPRESSIONS   1. Left ventricular ejection fraction, by estimation, is 60 to 65%. The left ventricle has normal function. The left ventricle has no regional wall motion abnormalities. Left ventricular diastolic parameters are consistent with Grade I diastolic dysfunction (impaired relaxation). 2. Right ventricular systolic function is normal. The right ventricular size is normal. There is normal pulmonary artery systolic pressure. 3. The mitral valve is normal in structure. No evidence of mitral valve regurgitation. No evidence of mitral stenosis. 4. The aortic valve is tricuspid. Aortic valve regurgitation is not visualized. No aortic stenosis is present. 5. The inferior vena cava is normal in size with greater than 50% respiratory variability, suggesting right atrial pressure of 3 mmHg.  FINDINGS Left Ventricle: Left ventricular ejection fraction, by estimation, is 60 to 65%. The left ventricle has normal function. The left ventricle has no regional wall motion abnormalities. The left ventricular internal cavity size was normal in size. There is no left ventricular hypertrophy. Left ventricular diastolic parameters are consistent with Grade I diastolic dysfunction (impaired relaxation). Indeterminate filling pressures.  Right Ventricle: The right ventricular size is normal. No increase in right ventricular wall thickness. Right  ventricular systolic function is normal. There is normal pulmonary artery systolic pressure. The tricuspid regurgitant velocity is 1.95 m/s, and with an assumed right atrial pressure of 3 mmHg, the estimated right ventricular systolic pressure is 18.2 mmHg.  Left Atrium: Left atrial size was normal in size.  Right Atrium: Right atrial size was normal in size.  Pericardium: There is no evidence of pericardial effusion.  Mitral Valve: The mitral valve is normal in structure. No evidence of mitral valve regurgitation. No evidence of mitral valve stenosis.  Tricuspid Valve: The tricuspid valve is normal in structure. Tricuspid valve regurgitation is trivial. No evidence of tricuspid stenosis.  Aortic Valve: The aortic valve is tricuspid. Aortic valve regurgitation is not visualized. No aortic stenosis is present.  Pulmonic Valve: The pulmonic valve was normal in structure. Pulmonic valve regurgitation is not visualized. No evidence of pulmonic stenosis.  Aorta: The aortic root, ascending aorta and aortic arch are all structurally normal, with no evidence of dilitation or obstruction.  Venous: A normal flow pattern is recorded from the right upper pulmonary vein. The inferior vena cava is normal in size with greater than 50% respiratory variability, suggesting right atrial pressure of 3 mmHg.  IAS/Shunts: No atrial level shunt detected by color flow Doppler.   LEFT VENTRICLE PLAX 2D LVIDd:         4.90 cm   Diastology LVIDs:         3.10 cm   LV e' medial:    6.20 cm/s LV PW:         0.80 cm   LV E/e' medial:  9.3 LV IVS:        0.80 cm   LV e' lateral:   9.03 cm/s LVOT diam:     2.00 cm   LV E/e' lateral: 6.4 LV SV:         55 LV SV Index:   30        2D Longitudinal Strain LVOT Area:     3.14 cm  2D Strain GLS Avg:     -11.6 %   RIGHT VENTRICLE             IVC RV S prime:     10.40 cm/s  IVC diam: 1.60 cm TAPSE (M-mode): 2.7 cm  LEFT ATRIUM             Index        RIGHT ATRIUM            Index LA diam:        3.10 cm 1.68 cm/m   RA Area:     14.00 cm LA Vol (A2C):   43.6 ml 23.67 ml/m  RA Volume:   34.10 ml  18.51 ml/m LA Vol (A4C):   33.9 ml 18.40 ml/m LA Biplane Vol: 40.8 ml 22.15 ml/m AORTIC VALVE LVOT Vmax:   81.60 cm/s LVOT Vmean:  54.900 cm/s LVOT VTI:    0.175 m  AORTA Ao Root diam: 3.30 cm Ao Asc diam:  2.90 cm Ao Desc diam:  2.00 cm  MITRAL VALVE               TRICUSPID VALVE MV Area (PHT): 4.44 cm    TR Peak grad:   15.2 mmHg MV Decel Time: 171 msec    TR Vmax:        195.00 cm/s MV E velocity: 57.80 cm/s MV A velocity: 51.00 cm/s  SHUNTS MV E/A ratio:  1.13        Systemic VTI:  0.18 m Systemic Diam: 2.00 cm  Redell Leiter MD Electronically signed by Redell Leiter MD Signature Date/Time: 09/14/2022/12:33:37 PM    Final     CARDIAC MRI  MR CARDIAC MORPHOLOGY W WO CONTRAST 12/09/2022  Narrative CLINICAL DATA:  Abnormal ECG R/O hypertrophic cardiomyopathy  EXAM: CARDIAC MRI  TECHNIQUE: The patient was scanned on a 1.5 Tesla Siemens magnet. A dedicated cardiac coil was used. Functional imaging was done using [employer] sequences. 2,3, and 4 chamber views were done to assess for RWMA's. Modified Simpson's rule using a short axis stack was used to calculate an ejection fraction on a dedicated work [employer]. The patient received 10 cc of Gadavist . After 10 minutes inversion recovery sequences were used to assess for infiltration and scar tissue.  CONTRAST:  Gadavist   FINDINGS: Normal atrial sizes No PFO/ASD. No pericardial effusion. Normal ascending thoracic aorta 2.9 cm. Normal mitral, tricuspid and pulmonary valves. Normal tri leaflet aortic vlave  LV is normal in size and function No LVH Septal and posterior wall thickness are 8 mm There is no SAM or LVOT turbulence  Quantitative LVEF 61% (EDV 130 cc ESV 51 cc SV 79 cc) Myocardial Mass 74 grams Normal range 43-103 grams in woman  Quantitative RVEF 62%  (EDV 149 cc ESV 57 cc SV 92 cc )  T1 normal 1051 msec  ECV: Normal 28% using Hct of 40  T2 normal 49 msec  Cardiac Output calculated from flow analysis through orthogonal plane of AV was equal to 4.9 L/min  IMPRESSION: 1.  Normal LV size and function LVEF 61%  2.  Normal LV mass 74 grams with no LVH septal thickness 8 mm  3.  Normal RV size and function RVEF 62%  4.  Normal cardiac valves  5.  No pericardial effusion  6.  Normal ascending thoracic aorta 2.9 cm  7.  No ASD/PFO  Carmen Ramirez   Electronically Signed By: Carmen Ramirez M.D. On: 12/09/2022 14:11         Risk Assessment/Calculations:             Physical Exam:   VS:  BP 107/74 (BP Location: Left Arm, Patient Position: Sitting, Cuff Size: Normal)   Pulse 70   Ht 5' 7 (1.702 m)   Wt 170 lb (77.1 kg)   SpO2 99%   BMI 26.63 kg/m    Wt Readings from Last 3 Encounters:  09/30/23 170 lb (77.1 kg)  09/30/22 165 lb 12.6 oz (75.2 kg)  09/10/22 160 lb 9.6 oz (72.8 kg)    GEN: Well nourished, well developed in no acute distress NECK: No JVD; No carotid bruits CARDIAC: RRR, no murmurs, rubs, gallops RESPIRATORY:  Clear to auscultation without rales, wheezing or rhonchi  ABDOMEN: Soft, non-tender, non-distended EXTREMITIES:  No edema; No deformity   ASSESSMENT AND PLAN: .   Abnormal EKG -EKG reveals inverted T waves, this been evaluated extensively with an echo and cardiac MRI--biopsies were noncontributory.  She has no formal complaints.  Very physically  active.  Hypertension-continue nebivolol 20 mg daily, blood pressure well-controlled today at 107/74.  MTHFR gene mutation-on aspirin 325 mg daily.       Dispo: Follow-up in 2 years.  Signed, Delon JAYSON Hoover, NP  "

## 2023-09-30 ENCOUNTER — Ambulatory Visit: Payer: BC Managed Care – PPO | Attending: Cardiology | Admitting: Cardiology

## 2023-09-30 ENCOUNTER — Encounter: Payer: Self-pay | Admitting: Cardiology

## 2023-09-30 VITALS — BP 107/74 | HR 70 | Ht 67.0 in | Wt 170.0 lb

## 2023-09-30 DIAGNOSIS — I10 Essential (primary) hypertension: Secondary | ICD-10-CM | POA: Diagnosis not present

## 2023-09-30 DIAGNOSIS — Z1589 Genetic susceptibility to other disease: Secondary | ICD-10-CM

## 2023-09-30 DIAGNOSIS — R9431 Abnormal electrocardiogram [ECG] [EKG]: Secondary | ICD-10-CM

## 2023-09-30 NOTE — Patient Instructions (Signed)
Medication Instructions:  Your physician recommends that you continue on your current medications as directed. Please refer to the Current Medication list given to you today.  *If you need a refill on your cardiac medications before your next appointment, please call your pharmacy*   Lab Work: None Ordered If you have labs (blood work) drawn today and your tests are completely normal, you will receive your results only by: MyChart Message (if you have MyChart) OR A paper copy in the mail If you have any lab test that is abnormal or we need to change your treatment, we will call you to review the results.   Testing/Procedures: None Ordered   Follow-Up: At [facility], you and your health needs are our priority.  As part of our continuing mission to provide you with exceptional heart care, we have created designated Provider Care Teams.  These Care Teams include your primary Cardiologist (physician) and Advanced Practice Providers (APPs -  Physician Assistants and Nurse Practitioners) who all work together to provide you with the care you need, when you need it.  We recommend signing up for the patient portal called "MyChart".  Sign up information is provided on this After Visit Summary.  MyChart is used to connect with patients for Virtual Visits (Telemedicine).  Patients are able to view lab/test results, encounter notes, upcoming appointments, etc.  Non-urgent messages can be sent to your provider as well.   To learn more about what you can do with MyChart, go to [email or web address].    Your next appointment:   2 year follow up

## 2023-10-25 IMAGING — CT CT FOOT*L* W/O CM
2 series · 15 of 20 positions shown, 18 images · non-contrast
Comparison: Left foot x-rays dated November 29, 2021.

CLINICAL DATA: Recent Lisfranc ORIF with concern for re-injury.

EXAM:
CT OF THE LEFT FOOT WITHOUT CONTRAST
TECHNIQUE: Multidetector CT imaging of the left foot was performed according to
the standard protocol. Multiplanar CT image reconstructions were
also generated.
RADIATION DOSE REDUCTION: This exam was performed according to the
departmental dose-optimization program which includes automated
exposure control, adjustment of the mA and/or kV according to
patient size and/or use of iterative reconstruction technique.

[Series 4: lower ext 1.5 st · axial · 0.49mm/px · z∈[-264,-112]mm · 12 of 121 slices shown, 15 images]
[im 10/121  soft-tissue]
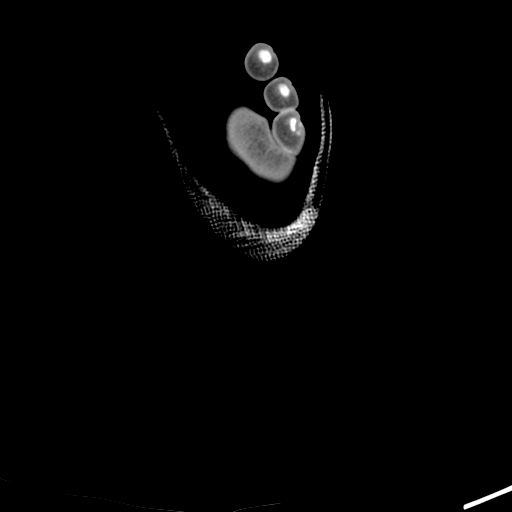
[im 10/121  bone]
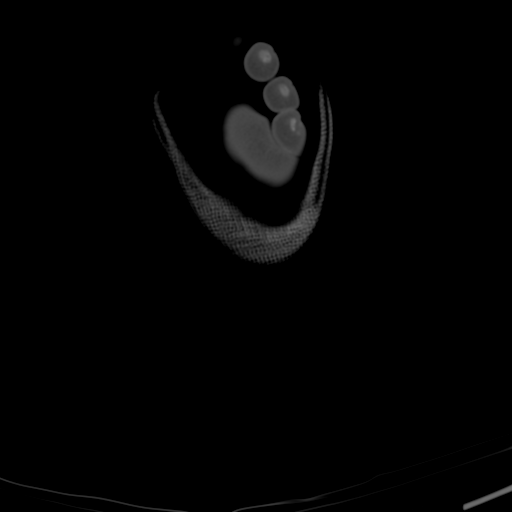
[im 19/121  bone]
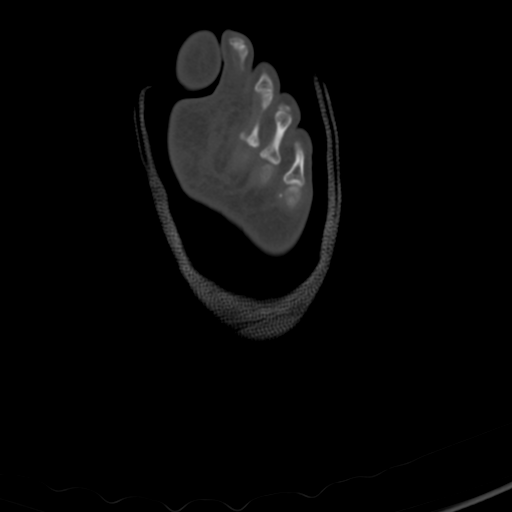
[im 28/121  bone]
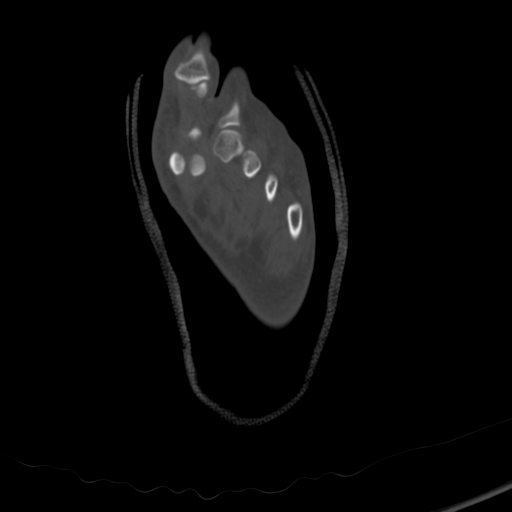
[im 37/121  bone]
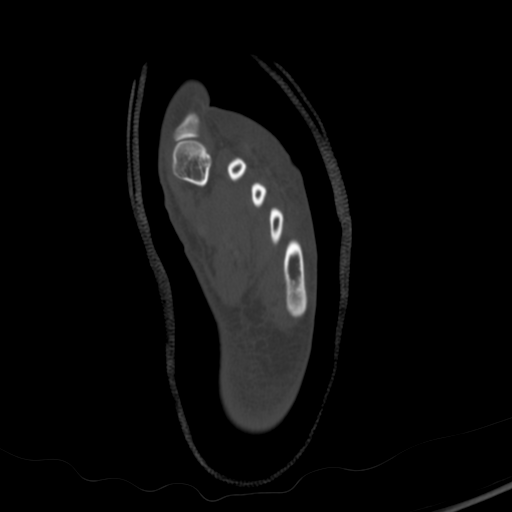
[im 47/121  soft-tissue]
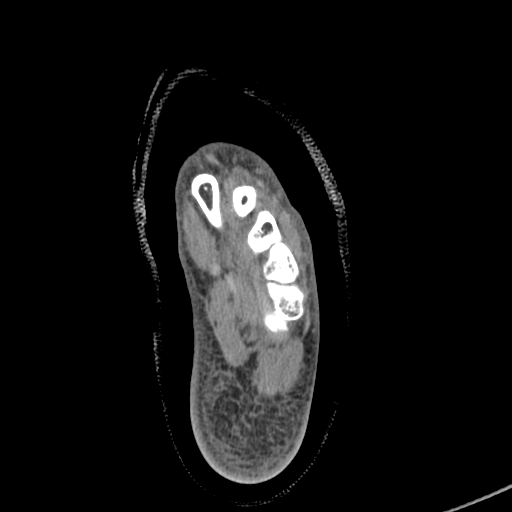
[im 47/121  bone]
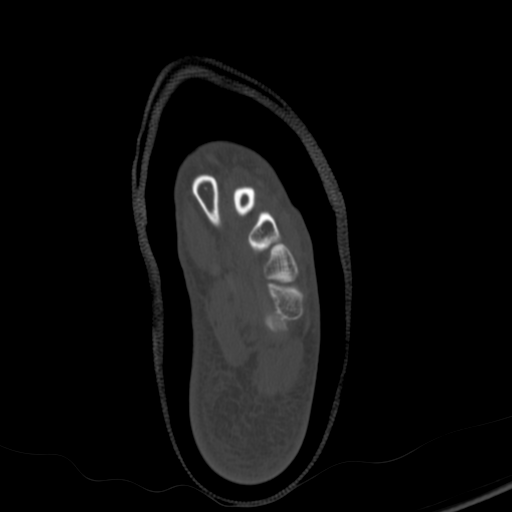
[im 56/121  bone]
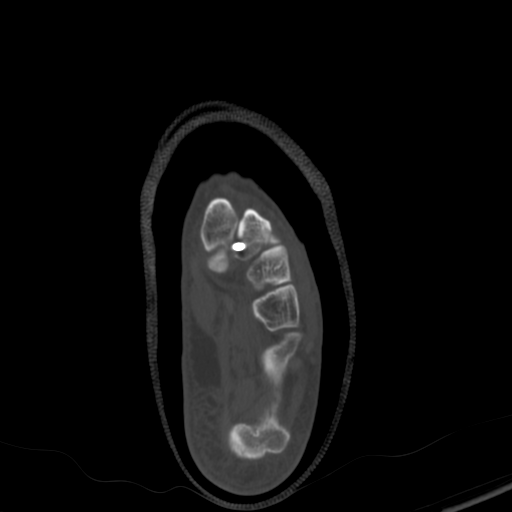
[im 65/121  bone]
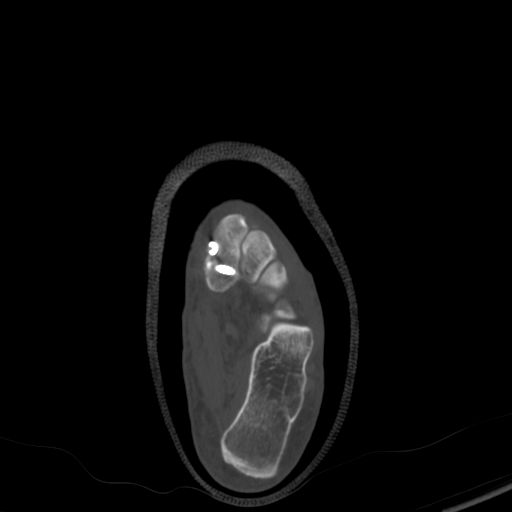
[im 74/121  bone]
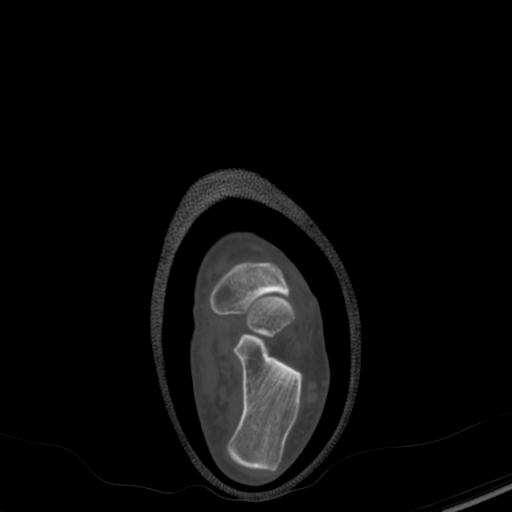
[im 84/121  soft-tissue]
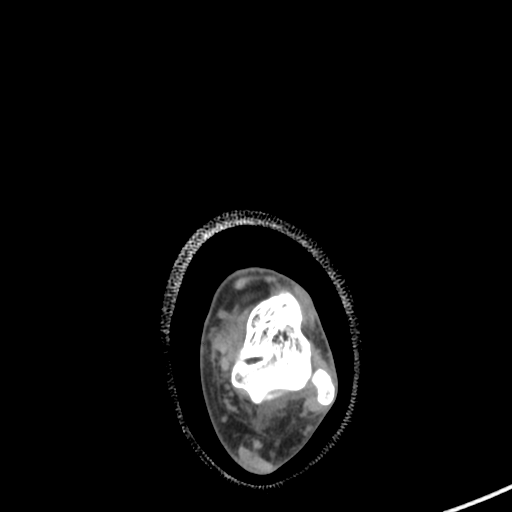
[im 84/121  bone]
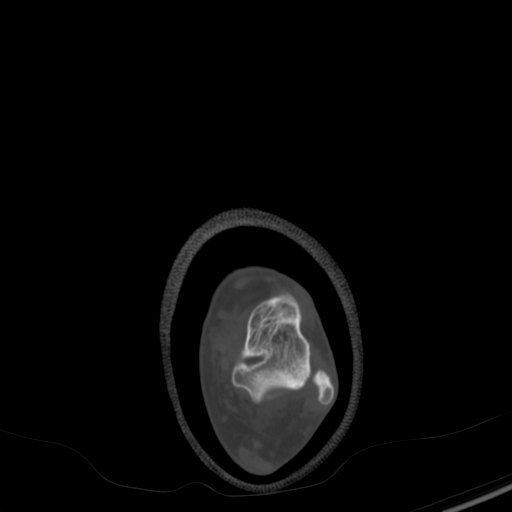
[im 93/121  bone]
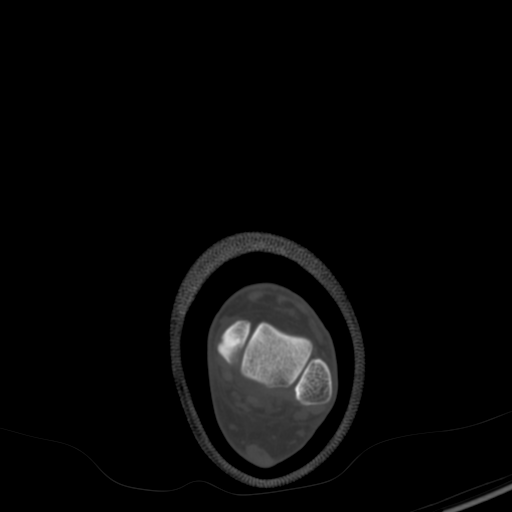
[im 102/121  bone]
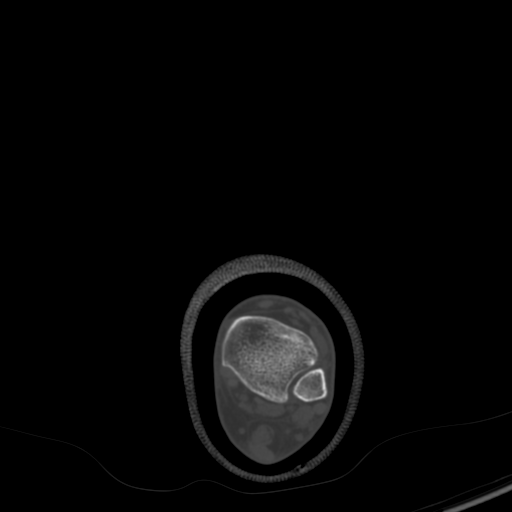
[im 111/121  bone]
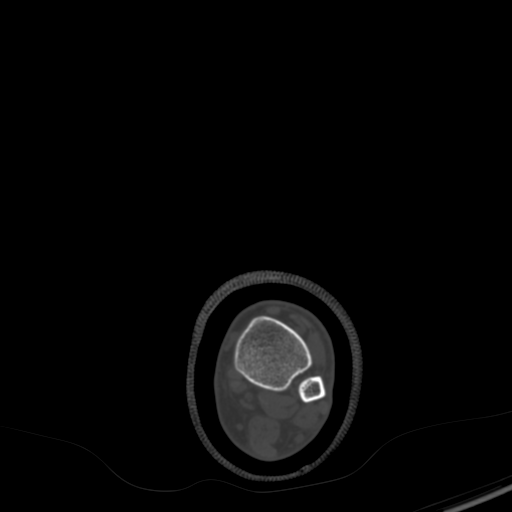

[Series 603: axial bone · coronal · 0.52mm/px · 3 of 168 slices shown]
[im 62/168  bone]
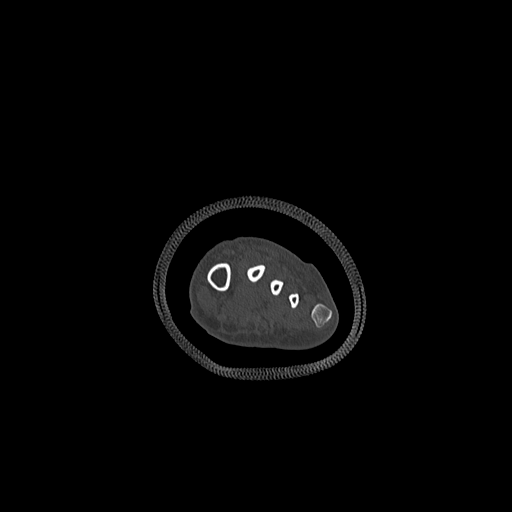
[im 77/168  bone]
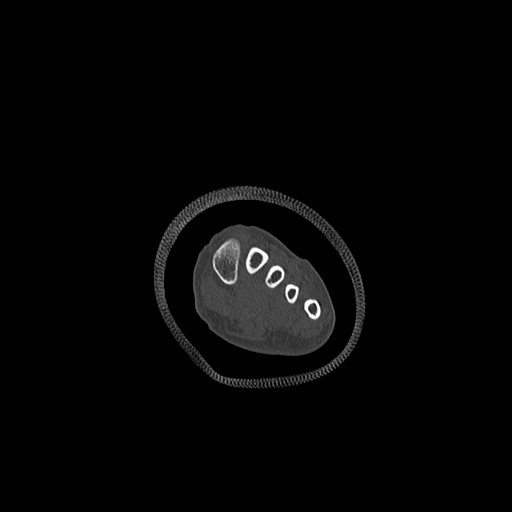
[im 91/168  bone]
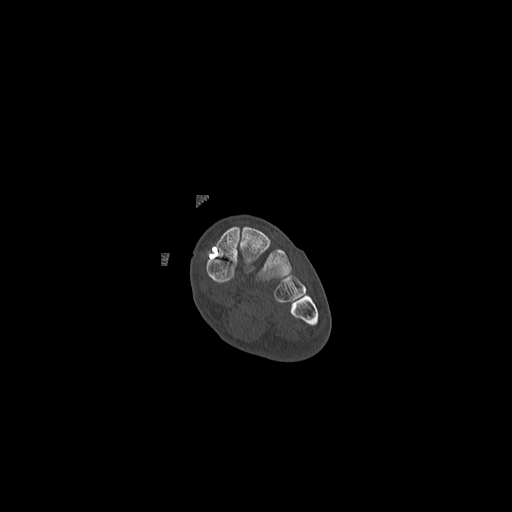

[15 of 20 positions shown; findings below may reference images not displayed]

FINDINGS: Bones/Joint/Cartilage

Tiny acute avulsion fracture of the dorsal medial cuneiform at the
Lisfranc ligament attachment (series 3, image 60). Acute
nondisplaced fracture at the plantar base of the second metatarsal.
The two fixation screws are unchanged in position compared to
intraoperative x-rays, although the Lisfranc screw has limited bony
purchase through the base of the second metatarsal since it
traverses the fracture (series 604, image 29). Additionally the
medial-middle cuneiform screw only has partial purchase of the
inferior aspect of the middle cuneiform (series 604, image 29). No
lucency around the screws. Lisfranc alignment is maintained.

Joint spaces are preserved. No joint effusion.

Ligaments

Ligaments are suboptimally evaluated by CT.

Muscles and Tendons
Grossly intact.

Soft tissue
Mild diffuse soft tissue swelling. No fluid collection or hematoma.
No soft tissue mass.
IMPRESSION: 1. Tiny acute avulsion fracture of the dorsal medial cuneiform at
the Lisfranc ligament attachment and acute nondisplaced fracture at
the plantar base of the second metatarsal, presumably injured prior
to surgery although no preoperative studies are available for
comparison.
2. The two fixation screws are unchanged in position compared to
intraoperative x-rays, although the Lisfranc screw has limited bony
purchase through the base of the second metatarsal since it
traverses the fracture. Additionally the medial-middle cuneiform
screw only has partial purchase of the inferior aspect of the middle
cuneiform.
